# Patient Record
Sex: Female | Born: 1980 | Race: White | Hispanic: No | Smoking: Current every day smoker
Health system: Southern US, Community
[De-identification: ages and names within clinical notes are randomized; demographics above are authoritative.]

## PROBLEM LIST (undated history)

## (undated) DIAGNOSIS — F419 Anxiety disorder, unspecified: Secondary | ICD-10-CM

## (undated) DIAGNOSIS — F32A Depression, unspecified: Secondary | ICD-10-CM

## (undated) DIAGNOSIS — E039 Hypothyroidism, unspecified: Secondary | ICD-10-CM

## (undated) DIAGNOSIS — F329 Major depressive disorder, single episode, unspecified: Secondary | ICD-10-CM

## (undated) DIAGNOSIS — IMO0002 Reserved for concepts with insufficient information to code with codable children: Secondary | ICD-10-CM

## (undated) HISTORY — DX: Reserved for concepts with insufficient information to code with codable children: IMO0002

## (undated) HISTORY — DX: Hypothyroidism, unspecified: E03.9

## (undated) HISTORY — PX: EYE SURGERY: SHX253

## (undated) HISTORY — PX: TONSILLECTOMY: SUR1361

---

## 2012-04-11 ENCOUNTER — Telehealth (HOSPITAL_COMMUNITY): Payer: Self-pay | Admitting: *Deleted

## 2012-04-11 ENCOUNTER — Inpatient Hospital Stay (HOSPITAL_COMMUNITY)
Admission: AD | Admit: 2012-04-11 | Discharge: 2012-04-14 | DRG: 897 | Disposition: A | Discharge status: Home / Self Care | Payer: Federal, State, Local not specified - Other | Source: Ambulatory Visit | Attending: Psychiatry | Admitting: Psychiatry

## 2012-04-11 ENCOUNTER — Encounter (HOSPITAL_COMMUNITY): Payer: Self-pay | Admitting: *Deleted

## 2012-04-11 DIAGNOSIS — G8929 Other chronic pain: Secondary | ICD-10-CM | POA: Diagnosis present

## 2012-04-11 DIAGNOSIS — E039 Hypothyroidism, unspecified: Secondary | ICD-10-CM | POA: Diagnosis present

## 2012-04-11 DIAGNOSIS — F1110 Opioid abuse, uncomplicated: Secondary | ICD-10-CM | POA: Diagnosis present

## 2012-04-11 DIAGNOSIS — F1210 Cannabis abuse, uncomplicated: Secondary | ICD-10-CM | POA: Diagnosis present

## 2012-04-11 DIAGNOSIS — IMO0002 Reserved for concepts with insufficient information to code with codable children: Secondary | ICD-10-CM | POA: Diagnosis present

## 2012-04-11 DIAGNOSIS — F17200 Nicotine dependence, unspecified, uncomplicated: Secondary | ICD-10-CM | POA: Diagnosis present

## 2012-04-11 DIAGNOSIS — F1020 Alcohol dependence, uncomplicated: Secondary | ICD-10-CM | POA: Diagnosis present

## 2012-04-11 DIAGNOSIS — F1120 Opioid dependence, uncomplicated: Principal | ICD-10-CM | POA: Diagnosis present

## 2012-04-11 DIAGNOSIS — F4310 Post-traumatic stress disorder, unspecified: Secondary | ICD-10-CM | POA: Diagnosis present

## 2012-04-11 HISTORY — DX: Reserved for concepts with insufficient information to code with codable children: IMO0002

## 2012-04-11 MED ORDER — DICYCLOMINE HCL 20 MG PO TABS
20.0000 mg | ORAL_TABLET | Freq: Four times a day (QID) | ORAL | Status: DC | PRN
  Administered 2012-04-11: 20 mg via ORAL
  Filled 2012-04-11: qty 1

## 2012-04-11 MED ORDER — NAPROXEN 500 MG PO TABS
500.0000 mg | ORAL_TABLET | Freq: Two times a day (BID) | ORAL | Status: DC | PRN
  Administered 2012-04-11 – 2012-04-12 (×3): 500 mg via ORAL
  Filled 2012-04-11 (×3): qty 1
  Filled 2012-04-11: qty 10

## 2012-04-11 MED ORDER — MAGNESIUM HYDROXIDE 400 MG/5ML PO SUSP: 30.0000 mL | Freq: Every day | ORAL | Status: DC | PRN

## 2012-04-11 MED ORDER — CLONIDINE HCL 0.1 MG PO TABS: 0.1000 mg | ORAL_TABLET | Freq: Every day | ORAL | Status: DC

## 2012-04-11 MED ORDER — ONDANSETRON 4 MG PO TBDP
4.0000 mg | ORAL_TABLET | Freq: Four times a day (QID) | ORAL | Status: DC | PRN
  Administered 2012-04-11: 4 mg via ORAL

## 2012-04-11 MED ORDER — ACETAMINOPHEN 325 MG PO TABS: 650.0000 mg | ORAL_TABLET | Freq: Four times a day (QID) | ORAL | Status: DC | PRN

## 2012-04-11 MED ORDER — LOPERAMIDE HCL 2 MG PO CAPS: 2.0000 mg | ORAL_CAPSULE | ORAL | Status: DC | PRN

## 2012-04-11 MED ORDER — HYDROXYZINE HCL 25 MG PO TABS
25.0000 mg | ORAL_TABLET | Freq: Four times a day (QID) | ORAL | Status: DC | PRN
  Administered 2012-04-12 – 2012-04-13 (×2): 25 mg via ORAL

## 2012-04-11 MED ORDER — CLONIDINE HCL 0.1 MG PO TABS
0.1000 mg | ORAL_TABLET | Freq: Four times a day (QID) | ORAL | Status: AC
  Administered 2012-04-11 – 2012-04-13 (×8): 0.1 mg via ORAL
  Filled 2012-04-11 (×11): qty 1

## 2012-04-11 MED ORDER — TRAZODONE HCL 100 MG PO TABS
100.0000 mg | ORAL_TABLET | Freq: Every evening | ORAL | Status: DC | PRN
  Administered 2012-04-11 – 2012-04-13 (×3): 100 mg via ORAL
  Filled 2012-04-11 (×3): qty 1

## 2012-04-11 MED ORDER — CLONIDINE HCL 0.1 MG PO TABS
0.1000 mg | ORAL_TABLET | ORAL | Status: DC
  Filled 2012-04-11 (×4): qty 1

## 2012-04-11 MED ORDER — ALUM & MAG HYDROXIDE-SIMETH 200-200-20 MG/5ML PO SUSP: 30.0000 mL | ORAL | Status: DC | PRN

## 2012-04-11 MED ORDER — METHOCARBAMOL 500 MG PO TABS
500.0000 mg | ORAL_TABLET | Freq: Three times a day (TID) | ORAL | Status: DC | PRN
  Administered 2012-04-11 – 2012-04-13 (×3): 500 mg via ORAL
  Filled 2012-04-11 (×3): qty 1
  Filled 2012-04-11: qty 15

## 2012-04-11 MED ORDER — NICOTINE POLACRILEX 2 MG MT GUM
2.0000 mg | CHEWING_GUM | OROMUCOSAL | Status: DC | PRN
  Administered 2012-04-12 – 2012-04-13 (×6): 2 mg via ORAL
  Filled 2012-04-11: qty 60
  Filled 2012-04-11: qty 1

## 2012-04-11 NOTE — Tx Team (Addendum)
Initial Interdisciplinary Treatment Plan  PATIENT STRENGTHS: (choose at least two) Ability for insight Average or above average intelligence Capable of independent living Motivation for treatment/growth Supportive family/friends  PATIENT STRESSORS: Substance abuse   PROBLEM LIST: Problem List/Patient Goals Date to be addressed Date deferred Reason deferred Estimated date of resolution  Substance abuse (opiates) 04/11/12     Depression 04/11/12     Suicidal Ideation 04/11/12                                          DISCHARGE CRITERIA:  Improved stabilization in mood, thinking, and/or behavior Motivation to continue treatment in a less acute level of care Withdrawal symptoms are absent or subacute and managed without 24-hour nursing intervention  PRELIMINARY DISCHARGE PLAN: Attend 12-step recovery group Return to previous living arrangement  PATIENT/FAMIILY INVOLVEMENT: This treatment plan has been presented to and reviewed with the patient, Holly Castro.  The patient and family have been given the opportunity to ask questions and make suggestions.  Juliann Pares 04/11/2012, 10:08 PM

## 2012-04-11 NOTE — Progress Notes (Signed)
Pt is 31 year old Caucasion female admitted to the services of Dr. Lewis Shock for the detox from opiates and depression.  Pt last took pain pills ("whatever I can get") on 04/09/12, UDS was negative.   Pt is married with children and wishes to get clean so that she can "stop living this way".  At present Pt has minimal withdrawal symptoms from opiates but is extremely anxious regarding cigarette withdrawal.  She smokes more than 2 packs daily.  Pt states that once she has completed her opiate detox, she would like to program on the [facility] hall to address her depression.  Pt lists her grandmother, Holly Castro, as her emergency contact [phone]

## 2012-04-12 DIAGNOSIS — F329 Major depressive disorder, single episode, unspecified: Secondary | ICD-10-CM

## 2012-04-12 DIAGNOSIS — F1110 Opioid abuse, uncomplicated: Secondary | ICD-10-CM | POA: Diagnosis present

## 2012-04-12 DIAGNOSIS — F4310 Post-traumatic stress disorder, unspecified: Secondary | ICD-10-CM | POA: Diagnosis present

## 2012-04-12 DIAGNOSIS — M79606 Pain in leg, unspecified: Secondary | ICD-10-CM | POA: Diagnosis present

## 2012-04-12 MED ORDER — FLUOXETINE HCL 20 MG PO CAPS
20.0000 mg | ORAL_CAPSULE | Freq: Every day | ORAL | Status: DC
  Administered 2012-04-12 – 2012-04-14 (×3): 20 mg via ORAL
  Filled 2012-04-12: qty 1
  Filled 2012-04-12: qty 14
  Filled 2012-04-12 (×4): qty 1

## 2012-04-12 MED ORDER — LIDOCAINE 5 % EX PTCH
1.0000 | MEDICATED_PATCH | CUTANEOUS | Status: DC
  Administered 2012-04-12 – 2012-04-14 (×3): 1 via TRANSDERMAL
  Filled 2012-04-12 (×6): qty 1
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
## 2012-04-12 NOTE — Progress Notes (Signed)
Patient ID: Holly Castro, female   DOB: [birth year], 31 y.o.   MRN: [id number]  Pt. attended and participated in aftercare planning group. Pt. verbally accepted information on suicide prevention, warning signs to look for with suicide and crisis line numbers to use. Pt. listed their current anxiety level and depression level as high. Pt. spoke about past family issues, self-medicating, negative self-talk, and her struggles as a mother. Pt. aso shared past suicide idealities but is not currently having these thoughts.
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
## 2012-04-12 NOTE — Progress Notes (Signed)
Pt in dayroom on approach, only complaint remains not being able to smoke.  Denies SI/HI/hallucinations at this time.  Positive for evening group, interacting appropriately on unit.  Denies S/S of opiate withdrawal, states that she just needs her nicorette gum and something for sleep and "I will be fine".  Support and encouragement offered, will continue to monitor.
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
## 2012-04-12 NOTE — BHH Suicide Risk Assessment (Signed)
Suicide Risk Assessment  Admission Assessment     Demographic factors:  Assessment Details Time of Assessment: Admission Information Obtained From: Patient Current Mental Status:  Current Mental Status:  (Pt denies) Loss Factors:  Loss Factors:  (denies) Historical Factors:  Historical Factors: Family history of mental illness or substance abuse Risk Reduction Factors:  Risk Reduction Factors: Responsible for children under 29 years of age;Sense of responsibility to family;Living with another person, especially a relative;Positive social support  CLINICAL FACTORS:   Severe Anxiety and/or Agitation Panic Attacks Depression:   Anhedonia Comorbid alcohol abuse/dependence Hopelessness Impulsivity Insomnia Recent sense of peace/wellbeing Dysthymia Alcohol/Substance Abuse/Dependencies Personality Disorders:   Cluster B Chronic Pain Previous Psychiatric Diagnoses and Treatments  COGNITIVE FEATURES THAT CONTRIBUTE TO RISK:  Closed-mindedness Loss of executive function Polarized thinking    SUICIDE RISK:   Minimal: No identifiable suicidal ideation.  Patients presenting with no risk factors but with morbid ruminations; may be classified as minimal risk based on the severity of the depressive symptoms  PLAN OF CARE: Patient was admitted and involuntarily for depression and opiate abuse. Patient was signing consent for treatment voluntarily. Patient will be discharged upon stable from a depression and the detox treatment and able to tolerate outpatient psychiatric services.   [place],Holly R. 04/12/2012, 12:09 PM

## 2012-04-12 NOTE — Progress Notes (Addendum)
[facility] Adult Inpatient Family/Significant Other Suicide Prevention Education  Suicide Prevention Education:  Education Completed; Jill Side (grandmother) [phone],  (name of family member/significant other) has been identified by the patient as the family member/significant other with whom the patient will be residing, and identified as the person(s) who will aid the patient in the event of a mental health crisis (suicidal ideations/suicide attempt).  With written consent from the patient, the family member/significant other has been provided the following suicide prevention education, prior to the and/or following the discharge of the patient.  The suicide prevention education provided includes the following:  Suicide risk factors  Suicide prevention and interventions  National Suicide Hotline telephone number  [facility] assessment telephone number  [facility] Emergency Assistance 911  [facility] and/or Residential Mobile Crisis Unit telephone number  Request made of family/significant other to:  Remove weapons (e.g., guns, rifles, knives), all items previously/currently identified as safety concern.    Remove drugs/medications (over-the-counter, prescriptions, illicit drugs), all items previously/currently identified as a safety concern.  The family member/significant other verbalizes understanding of the suicide prevention education information provided.  The family member/significant other agrees to remove the items of safety concern listed above.  Pt. accepted information on suicide prevention, warning signs to look for with suicide and crisis line numbers to use. The pt. agreed to call crisis line numbers if having warning signs or having thoughts of suicide.  Pt stated that she is not supported by her husband and did not want him contacted. Grandmother stated that "Allysha's problem is Mayela" she spoke about feeling the relationship with the pt and  her husband is fine, and the only problem is she can't be the boss or mange money.   Grandmother stated that the family has guns in a gun cabinet but that the pt has stated that she will not shoot her self for the pt. has told her that she "is afraid of pain".  Grandmother stated that the pt's use has gotten worse and she has gotten very hopeless. She would like to see the pt go to a long term treatment center (3-6 months). She is concerned that something short term wont work.  Grandmother stated she does not know how else to help the pt. She is supportive but does not know how to support her.  [facility] 04/12/2012, 4:20 PM

## 2012-04-12 NOTE — Progress Notes (Signed)
Pt is out occasionally in milieu. Behavior is appropriate. C/O generalized body aches and anxiety r/t to opiate withdrawal. Rates depression and hopelessness at 8. "Off and on" SI and contracts for safety.  Tearful at times.  Encouraged fluids. Attended groups.Consent for voluntary admission and treatment explained and signed. Support offered and 15' checks cont for safety.

## 2012-04-13 DIAGNOSIS — F1210 Cannabis abuse, uncomplicated: Secondary | ICD-10-CM

## 2012-04-13 DIAGNOSIS — F1120 Opioid dependence, uncomplicated: Principal | ICD-10-CM

## 2012-04-13 DIAGNOSIS — F1020 Alcohol dependence, uncomplicated: Secondary | ICD-10-CM

## 2012-04-13 DIAGNOSIS — F4310 Post-traumatic stress disorder, unspecified: Secondary | ICD-10-CM

## 2012-04-13 MED ORDER — LORATADINE 10 MG PO TABS
10.0000 mg | ORAL_TABLET | Freq: Every day | ORAL | Status: DC
  Administered 2012-04-13 – 2012-04-14 (×2): 10 mg via ORAL
  Filled 2012-04-13: qty 1
  Filled 2012-04-13: qty 14
  Filled 2012-04-13 (×3): qty 1

## 2012-04-13 NOTE — Progress Notes (Signed)
[facility] MD Progress Note  04/13/2012 3:07 PM  S/O: Patient seen and evaluated. Chart reviewed. Patient stated that her mood was "not good". Her affect was mood congruent and ambivalent about recovery. She denied any current thoughts of self injurious behavior, suicidal ideation or homicidal ideation. There were no auditory or visual hallucinations, paranoia, delusional thought processes, or mania noted.  Thought process was linear and goal directed.  No psychomotor agitation or retardation was noted. Speech was normal rate, tone and volume. Eye contact was good. Judgment and insight are fair.  Patient has been up and engaged on the unit.  No acute safety concerns reported from team.  Sig w/d s/s noted, pt tolerating taper well w/o complications.  Sleep:  Number of Hours: 6.25    Vital Signs:Blood pressure 101/72, pulse 120, temperature 96.5 F (35.8 C), temperature source Oral, resp. rate 20, height 5\' 5"  (1.651 m), weight 63.957 kg (141 lb).  Current Medications:    . cloNIDine  0.1 mg Oral QID   Followed by  . cloNIDine  0.1 mg Oral BH-qamhs   Followed by  . cloNIDine  0.1 mg Oral QAC breakfast  . FLUoxetine  20 mg Oral Daily  . lidocaine  1 patch Transdermal BH-q7a  . loratadine  10 mg Oral Daily    Lab Results: No results found for this or any previous visit (from the past 48 hour(s)).  Physical Findings: CIWA:  CIWA-Ar Total: 2  COWS:  COWS Total Score: 8   A/P: Opioid Dependence & W/D; Cannabis Abuse; Alcohol Abuse v. Dependence; PTSD  Pt seen and evaluated in treatment team.  Reviewed short term and long term goals, medications, current treatment in the hospital and acute/chronic safety.  Pt denied any current thoughts of self harm, suicidal ideation or homicidal ideation.  Contracted for safety on the unit.  Pt agreeable with treatment plan, see orders. Continue current medications as noted above, added Claritin today per pt request.        Holly Castro 04/13/2012,  3:07 PM

## 2012-04-13 NOTE — Discharge Planning (Signed)
New patient attended AM group, good participation.  States her "nosey grandmother" insisted she come in.  Admits to opiate dependence "to calm myself so I can deal with my husband."  Ambivalent about discontinuing use.  "I'm just confused about everything."  Will follow up with her about rehab as well as outpt options.

## 2012-04-13 NOTE — Progress Notes (Signed)
Patient ID: Holly Castro, female   DOB: 05/02/1981, 31 y.o.   MRN: [id number]      Patient was pleasant and cooperative during the assessment. However, when she was leaving the dayroom to go with the writer, pt stated, "some people need to get an ass".  Pt was referring to the MHT on the hall. When asked to explain, "he must be jealous. He asked [name] to move to another chair". "Well, is there some kind of attraction between the two of you?" "Yes, but it's not like we're gonna have sex or kiss on each other". Writer explained that staff wants to help them remain focused on treatment, and has to be aware of any inappropriate contact between patients. Informed her that not only can it distract the two of them but that it can also distract others.       Pt states that she doesn't want to go home as long as her husband is in the house. States she was informed of the possibility of an [employer] in [place]

## 2012-04-13 NOTE — Progress Notes (Signed)
Patient ID: Holly Castro, female   DOB: 31-May-1981, 32 y.o.   MRN: [id number] She has been up and about and to groups interacting with peers and staff. Continues to have Si thoughts off and on, contracts for safety here.

## 2012-04-13 NOTE — H&P (Signed)
Cosigned for Dr. Jonnalagadda. 

## 2012-04-13 NOTE — Treatment Plan (Signed)
Interdisciplinary Treatment Plan Update (Adult)  Date: 04/13/2012  Time Reviewed: 10:56 AM   Progress in Treatment: Attending groups: Yes Participating in groups: Yes Taking medication as prescribed: Yes Tolerating medication: Yes   Family/Significant other contact made:  No Patient understands diagnosis:  Yes  As evidenced by asking for help with opiate addiction Discussing patient identified problems/goals with staff:  Yes  See below Medical problems stabilized or resolved:  Yes Denies suicidal/homicidal ideation: Yes  In tx team Issues/concerns per patient self-inventory:  Yes  Depression 6, Hopelessness 9  C/O chilling cravings, lightheadedness, dizziness, headaches, pain Other:  New problem(s) identified: N/A  Reason for Continuation of Hospitalization: Depression Medication stabilization Withdrawal symptoms  Interventions implemented related to continuation of hospitalization:  Clonodine taper,  Claritin for allergies, encourage group attendance and participation  Additional comments:  Estimated length of stay:2-3 days  Discharge Plan:Unknown  New goal(s): N/A  Review of initial/current patient goals per problem list:   1.  Goal(s): Safely detox from opiates  Met:  No  Target date:5/29  As evidenced by:Stable vitals, COWS score of 0  2.  Goal (s): Stabilize mood  Met:  No  Target date:5/29  As evidenced by:[name] will report that her depression and anxiety are manageable  3.  Goal(s): Identify a comprehensive sobreity plan  Met:  No  Target date:5/29  As evidenced DG:LOVF report  4.  Goal(s):  Met:  No  Target date:  As evidenced by:  Attendees: Patient:  Holly Castro 04/13/2012 10:56 AM  Family:     Physician:  Holly Castro 04/13/2012 10:56 AM   Nursing:  Holly Castro  04/13/2012 10:56 AM   Case Manager:  Holly Ito, LCSW 04/13/2012 10:56 AM   Counselor:  04/13/2012 10:56 AM   Other:     Other:     Other:     Other:      Scribe  for Treatment Team:   Holly Castro, 04/13/2012 10:56 AM

## 2012-04-14 MED ORDER — TRAZODONE HCL 100 MG PO TABS: 100.0000 mg | ORAL_TABLET | Freq: Every evening | ORAL | Status: DC | PRN

## 2012-04-14 MED ORDER — METHOCARBAMOL 500 MG PO TABS: ORAL_TABLET | ORAL | Status: DC

## 2012-04-14 MED ORDER — LORATADINE 10 MG PO TABS: 10.0000 mg | ORAL_TABLET | Freq: Every day | ORAL | Status: DC

## 2012-04-14 MED ORDER — FLUOXETINE HCL 20 MG PO CAPS: 20.0000 mg | ORAL_CAPSULE | Freq: Every day | ORAL | Status: DC

## 2012-04-14 MED ORDER — NICOTINE POLACRILEX 2 MG MT GUM: 2.0000 mg | CHEWING_GUM | OROMUCOSAL | Status: AC | PRN

## 2012-04-14 MED ORDER — NAPROXEN 500 MG PO TABS: 500.0000 mg | ORAL_TABLET | Freq: Two times a day (BID) | ORAL | Status: AC | PRN

## 2012-04-14 NOTE — [facility] Suicide Risk Assessment (Signed)
Suicide Risk Assessment  Discharge Assessment      Demographic factors: Caucasian  Current Mental Status Per Nursing Assessment:   On Admission:   (Pt denies) At Discharge: Pt denied any SI/HI/thoughts of self harm or acute psychiatric issues in treatment team with clinical, nursing and medical team present.  Current Mental Status Per Physician:Patient seen and evaluated. Chart reviewed. Patient stated that her mood was "pretty good". Her affect was mood congruent and brighter. She denied any current thoughts of self injurious behavior, suicidal ideation or homicidal ideation. There were no auditory or visual hallucinations, paranoia, delusional thought processes, or mania noted. Thought process was linear and goal directed. No psychomotor agitation or retardation was noted. Speech was normal rate, tone and volume. Eye contact was good. Judgment and insight are fair. Patient has been up and engaged on the unit. No acute safety concerns reported from team.   Loss Factors:  Stress with family; hx trauma  Historical Factors:  Family history of mental illness or substance abuse  Risk Reduction Factors:  Responsible for children under 59 years of age;Sense of responsibility to family;Living with another person, especially a relative;Positive social support  Discharge Diagnoses: AXIS I:  Opioid Dependence; Cannabis Abuse; Alcohol Dependence; PTSD AXIS II: Deferred AXIS III:   Past Medical History  Diagnosis Date  . Hypothyroidism   . Herniated disc 04/11/2012  AXIS IV:  Moderate AXIS V:  50  Cognitive Features That Contribute To Risk: none.  Suicide Risk: Pt viewed as a chronic moderate increased risk of harm to self in light of her past hx and risk factors.  No acute safety concerns since on the unit.  Pt contracting for safety and stable for transition to [place].  Filed Vitals:   04/14/12 0730  BP: 98/75  Pulse: 91  Temp: 97 F (36.1 C)  Resp: 18    Plan Of Care/Follow-up  recommendations: Pt seen and evaluated in treatment team. Chart reviewed.  Pt stable for and bed available today at [facility].  Pt contracting for safety and does not currently meet [place] involuntary commitment criteria for continued inpt hospitalization against her will.  Mental health treatment, medication management and continued sobriety will mitigate against the potential increased risk of harm to self and/or others.  Discussed the importance of recovery further with pt, as well as, tools to move forward in a healthy & safe manner.  Pt agreeable with the plan.  Discussed with the team.  Please see orders, follow up appointments per AVS and full discharge summary to be completed by physician extender.  Recommend follow up with AA/NA.  Diet: Regular.  Activity: As tolerated.     Lupe Carney 04/14/2012, 11:03 AM

## 2012-04-14 NOTE — Progress Notes (Signed)
[facility] Case Management Discharge Plan:  Will you be returning to the same living situation after discharge: No. At discharge, do you have transportation home?:Yes,  [facility] to pick up today Do you have the ability to pay for your medications:Yes,  sent with supply  Interagency Information:     Release of information consent forms completed and in the chart;  Patient's signature needed at discharge.  Patient to Follow up at:  Follow-up Information    Follow up with [facility] on 04/14/2012. ([facility] will pick you up today)    Contact information:   [address] [place]   [336] [phone]         Patient denies SI/HI:   Yes,  yes    Safety Planning and Suicide Prevention discussed:  Yes,  yes  Barrier to discharge identified:No.  Summary and Recommendations:   Holly Castro 04/14/2012, 10:12 AM

## 2012-04-16 NOTE — Progress Notes (Signed)
Patient Discharge Instructions:  After Visit Summary (AVS):   Faxed to:  04/15/2012 Psychiatric Admission Assessment Note:   Faxed to:  04/15/2012 Suicide Risk Assessment - Discharge Assessment:   Faxed to:  04/15/2012 Faxed/Sent to the Next Level Care provider:  04/15/2012  Faxed to [facility] @ [phone]  Wandra Scot, 04/16/2012, 5:38 PM
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
## 2013-06-08 ENCOUNTER — Emergency Department (HOSPITAL_BASED_OUTPATIENT_CLINIC_OR_DEPARTMENT_OTHER)
Admission: EM | Admit: 2013-06-08 | Discharge: 2013-06-08 | Disposition: A | Discharge status: Home / Self Care | Payer: Self-pay | Attending: Emergency Medicine | Admitting: Emergency Medicine

## 2013-06-08 ENCOUNTER — Encounter (HOSPITAL_BASED_OUTPATIENT_CLINIC_OR_DEPARTMENT_OTHER): Payer: Self-pay | Admitting: *Deleted

## 2013-06-08 DIAGNOSIS — F17200 Nicotine dependence, unspecified, uncomplicated: Secondary | ICD-10-CM | POA: Insufficient documentation

## 2013-06-08 DIAGNOSIS — K089 Disorder of teeth and supporting structures, unspecified: Secondary | ICD-10-CM | POA: Insufficient documentation

## 2013-06-08 DIAGNOSIS — Z8639 Personal history of other endocrine, nutritional and metabolic disease: Secondary | ICD-10-CM | POA: Insufficient documentation

## 2013-06-08 DIAGNOSIS — Z79899 Other long term (current) drug therapy: Secondary | ICD-10-CM | POA: Insufficient documentation

## 2013-06-08 DIAGNOSIS — K0889 Other specified disorders of teeth and supporting structures: Secondary | ICD-10-CM

## 2013-06-08 DIAGNOSIS — Z862 Personal history of diseases of the blood and blood-forming organs and certain disorders involving the immune mechanism: Secondary | ICD-10-CM | POA: Insufficient documentation

## 2013-06-08 DIAGNOSIS — Z8739 Personal history of other diseases of the musculoskeletal system and connective tissue: Secondary | ICD-10-CM | POA: Insufficient documentation

## 2013-06-08 HISTORY — DX: Depression, unspecified: F32.A

## 2013-06-08 HISTORY — DX: Major depressive disorder, single episode, unspecified: F32.9

## 2013-06-08 MED ORDER — TRAMADOL HCL 50 MG PO TABS: 50.0000 mg | ORAL_TABLET | Freq: Once | ORAL | Status: DC

## 2013-06-08 MED ORDER — TRAMADOL HCL 50 MG PO TABS
50.0000 mg | ORAL_TABLET | Freq: Once | ORAL | Status: AC
  Administered 2013-06-08: 50 mg via ORAL
  Filled 2013-06-08: qty 1

## 2013-06-08 MED ORDER — PENICILLIN V POTASSIUM 500 MG PO TABS: 500.0000 mg | ORAL_TABLET | Freq: Four times a day (QID) | ORAL | Status: DC

## 2013-06-08 NOTE — ED Notes (Signed)
Pt amb to room 6 with quick steady gait in nad. Pt reports tooth pain x 3 days, denies any fevers.

## 2013-06-08 NOTE — ED Provider Notes (Signed)
History    CSN: [id number] Arrival date & time 06/08/13  1157  First MD Initiated Contact with Patient 06/08/13 1208     No chief complaint on file.  (Consider location/radiation/quality/duration/timing/severity/associated sxs/prior Treatment) HPI  Patient is a 32 year old female presented to the emergency department for sharp right upper tooth pain with radiation to right ear that began 2 days ago. Patient states she's had a chipped tooth for a while now but it just began giving her trouble 2 days ago. She states cold liquids make the pain worse. No alleviating factors. Rates her pain 8/10. Denies fevers or chills.   Past Medical History  Diagnosis Date  . Hypothyroidism   . Herniated disc 04/11/2012   No past surgical history on file. No family history on file. History  Substance Use Topics  . Smoking status: Current Every Day Smoker -- 2.00 packs/day    Types: Cigarettes  . Smokeless tobacco: Not on file  . Alcohol Use: Yes   OB History   Grav Para Term Preterm Abortions TAB SAB Ect Mult Living                 Review of Systems  Constitutional: Negative for fever and chills.  HENT: Positive for dental problem. Negative for sore throat, facial swelling, drooling, mouth sores, trouble swallowing, neck pain, neck stiffness and voice change.     Allergies  Review of patient's allergies indicates no known allergies.  Home Medications   Current Outpatient Rx  Name  Route  Sig  Dispense  Refill  . FLUoxetine (PROZAC) 20 MG capsule   Oral   Take 1 capsule (20 mg total) by mouth daily. For depression and anxiety   30 capsule   0   . EXPIRED: loratadine (CLARITIN) 10 MG tablet   Oral   Take 1 tablet (10 mg total) by mouth daily. For allergy symptoms         . methocarbamol (ROBAXIN) 500 MG tablet      Take one tablet by mouth every 8 hours, as needed for muscle spasms for 5 more days.         . penicillin v potassium (VEETID) 500 MG tablet   Oral   Take 1  tablet (500 mg total) by mouth 4 (four) times daily.   40 tablet   0   . traMADol (ULTRAM) 50 MG tablet   Oral   Take 1 tablet (50 mg total) by mouth once.   15 tablet   0   . EXPIRED: traZODone (DESYREL) 100 MG tablet   Oral   Take 1 tablet (100 mg total) by mouth at bedtime as needed and may repeat dose one time if needed for sleep.   30 tablet   0    BP 129/81  Pulse 90  Temp(Src) 98 F (36.7 C) (Oral)  Resp 16  Ht 5\' 5"  (1.651 m)  Wt 170 lb (77.111 kg)  BMI 28.29 kg/m2  SpO2 100% Physical Exam  Constitutional: She is oriented to person, place, and time. She appears well-developed and well-nourished. No distress.  HENT:  Head: Normocephalic and atraumatic. No trismus in the jaw.  Right Ear: External ear normal.  Left Ear: External ear normal.  Nose: Nose normal.  Mouth/Throat: Uvula is midline, oropharynx is clear and moist and mucous membranes are normal. Abnormal dentition. Dental caries present. No dental abscesses or edematous. No oropharyngeal exudate.    Eyes: Conjunctivae are normal.  Neck: Neck supple.  Neurological: She is  alert and oriented to person, place, and time.  Skin: Skin is warm and dry. She is not diaphoretic.  Psychiatric: She has a normal mood and affect.    ED Course  Procedures (including critical care time) Labs Reviewed - No data to display No results found. 1. Pain, dental     MDM  Patient with toothache.  No gross abscess.  Exam unconcerning for Ludwig's angina or spread of infection.  Will treat with penicillin and pain medicine.  Urged patient to follow-up with dentist.     Jeannetta Ellis, PA-C 06/08/13 1537

## 2013-06-09 NOTE — ED Provider Notes (Signed)
Medical screening examination/treatment/procedure(s) were performed by non-physician practitioner and as supervising physician I was immediately available for consultation/collaboration.  Damarys Speir, MD 06/09/13 1809 

## 2013-07-19 ENCOUNTER — Emergency Department (HOSPITAL_COMMUNITY)
Admission: EM | Admit: 2013-07-19 | Discharge: 2013-07-20 | Disposition: A | Discharge status: Home / Self Care | Payer: Federal, State, Local not specified - Other | Attending: Emergency Medicine | Admitting: Emergency Medicine

## 2013-07-19 ENCOUNTER — Encounter (HOSPITAL_COMMUNITY): Payer: Self-pay

## 2013-07-19 DIAGNOSIS — F1010 Alcohol abuse, uncomplicated: Secondary | ICD-10-CM | POA: Insufficient documentation

## 2013-07-19 DIAGNOSIS — M545 Low back pain, unspecified: Secondary | ICD-10-CM | POA: Insufficient documentation

## 2013-07-19 DIAGNOSIS — Z862 Personal history of diseases of the blood and blood-forming organs and certain disorders involving the immune mechanism: Secondary | ICD-10-CM | POA: Insufficient documentation

## 2013-07-19 DIAGNOSIS — Z79899 Other long term (current) drug therapy: Secondary | ICD-10-CM | POA: Insufficient documentation

## 2013-07-19 DIAGNOSIS — F329 Major depressive disorder, single episode, unspecified: Secondary | ICD-10-CM | POA: Insufficient documentation

## 2013-07-19 DIAGNOSIS — F3289 Other specified depressive episodes: Secondary | ICD-10-CM | POA: Insufficient documentation

## 2013-07-19 DIAGNOSIS — G8929 Other chronic pain: Secondary | ICD-10-CM | POA: Insufficient documentation

## 2013-07-19 DIAGNOSIS — Z792 Long term (current) use of antibiotics: Secondary | ICD-10-CM | POA: Insufficient documentation

## 2013-07-19 DIAGNOSIS — Z3202 Encounter for pregnancy test, result negative: Secondary | ICD-10-CM | POA: Insufficient documentation

## 2013-07-19 DIAGNOSIS — F1110 Opioid abuse, uncomplicated: Secondary | ICD-10-CM | POA: Insufficient documentation

## 2013-07-19 DIAGNOSIS — F10229 Alcohol dependence with intoxication, unspecified: Secondary | ICD-10-CM

## 2013-07-19 DIAGNOSIS — R45851 Suicidal ideations: Secondary | ICD-10-CM | POA: Insufficient documentation

## 2013-07-19 DIAGNOSIS — F1994 Other psychoactive substance use, unspecified with psychoactive substance-induced mood disorder: Secondary | ICD-10-CM

## 2013-07-19 DIAGNOSIS — Z8639 Personal history of other endocrine, nutritional and metabolic disease: Secondary | ICD-10-CM | POA: Insufficient documentation

## 2013-07-19 DIAGNOSIS — F17200 Nicotine dependence, unspecified, uncomplicated: Secondary | ICD-10-CM | POA: Insufficient documentation

## 2013-07-19 DIAGNOSIS — F4310 Post-traumatic stress disorder, unspecified: Secondary | ICD-10-CM

## 2013-07-19 DIAGNOSIS — F1910 Other psychoactive substance abuse, uncomplicated: Secondary | ICD-10-CM | POA: Insufficient documentation

## 2013-07-19 LAB — COMPREHENSIVE METABOLIC PANEL
ALT: 10 U/L (ref 0–35)
AST: 16 U/L (ref 0–37)
Alkaline Phosphatase: 57 U/L (ref 39–117)
Calcium: 8.6 mg/dL (ref 8.4–10.5)
Potassium: 2.8 mEq/L — ABNORMAL LOW (ref 3.5–5.1)
Sodium: 140 mEq/L (ref 135–145)
Total Protein: 6.4 g/dL (ref 6.0–8.3)

## 2013-07-19 LAB — CBC
Hemoglobin: 14.5 g/dL (ref 12.0–15.0)
MCH: 31.7 pg (ref 26.0–34.0)
MCHC: 33.6 g/dL (ref 30.0–36.0)
Platelets: 233 10*3/uL (ref 150–400)
RBC: 4.57 MIL/uL (ref 3.87–5.11)

## 2013-07-19 LAB — RAPID URINE DRUG SCREEN, HOSP PERFORMED
Amphetamines: NOT DETECTED
Barbiturates: NOT DETECTED
Benzodiazepines: NOT DETECTED
Cocaine: NOT DETECTED
Opiates: NOT DETECTED
Tetrahydrocannabinol: NOT DETECTED

## 2013-07-19 LAB — POTASSIUM: Potassium: 3.8 mEq/L (ref 3.5–5.1)

## 2013-07-19 LAB — ACETAMINOPHEN LEVEL: Acetaminophen (Tylenol), Serum: <15 ug/mL (ref 10–30)

## 2013-07-19 MED ORDER — FLUOXETINE HCL 20 MG PO CAPS: 20.0000 mg | ORAL_CAPSULE | Freq: Every day | ORAL | Status: DC

## 2013-07-19 MED ORDER — HYDROXYZINE HCL 25 MG PO TABS
50.0000 mg | ORAL_TABLET | Freq: Three times a day (TID) | ORAL | Status: DC | PRN
  Administered 2013-07-19: 50 mg via ORAL
  Filled 2013-07-19: qty 2

## 2013-07-19 MED ORDER — IBUPROFEN 800 MG PO TABS
800.0000 mg | ORAL_TABLET | Freq: Once | ORAL | Status: AC
  Administered 2013-07-19: 800 mg via ORAL
  Filled 2013-07-19: qty 1

## 2013-07-19 MED ORDER — POTASSIUM CHLORIDE CRYS ER 20 MEQ PO TBCR
40.0000 meq | EXTENDED_RELEASE_TABLET | Freq: Once | ORAL | Status: AC
  Administered 2013-07-19: 40 meq via ORAL

## 2013-07-19 MED ORDER — SERTRALINE HCL 50 MG PO TABS
50.0000 mg | ORAL_TABLET | Freq: Every day | ORAL | Status: DC
  Administered 2013-07-19 – 2013-07-20 (×2): 50 mg via ORAL
  Filled 2013-07-19 (×2): qty 1

## 2013-07-19 MED ORDER — NICOTINE 21 MG/24HR TD PT24
21.0000 mg | MEDICATED_PATCH | Freq: Once | TRANSDERMAL | Status: AC
  Administered 2013-07-19: 21 mg via TRANSDERMAL
  Filled 2013-07-19: qty 1

## 2013-07-19 MED ORDER — METHOCARBAMOL 500 MG PO TABS
500.0000 mg | ORAL_TABLET | Freq: Three times a day (TID) | ORAL | Status: DC
  Administered 2013-07-19 – 2013-07-20 (×4): 500 mg via ORAL
  Filled 2013-07-19 (×4): qty 1

## 2013-07-19 MED ORDER — POTASSIUM CHLORIDE CRYS ER 20 MEQ PO TBCR
40.0000 meq | EXTENDED_RELEASE_TABLET | Freq: Two times a day (BID) | ORAL | Status: AC
  Administered 2013-07-19 – 2013-07-20 (×4): 40 meq via ORAL
  Filled 2013-07-19 (×3): qty 2
  Filled 2013-07-19: qty 1

## 2013-07-19 NOTE — ED Notes (Signed)
Patient wanded by security. 

## 2013-07-19 NOTE — ED Provider Notes (Signed)
CSN: [id number]     Arrival date & time 07/19/13  0014 History   First MD Initiated Contact with Patient 07/19/13 0118     Chief Complaint  Patient presents with  . Suicidal  . Medical Clearance   (Consider location/radiation/quality/duration/timing/severity/associated sxs/prior Treatment) HPI Hx provided by patient. Id here requesting detox from alcohol, drugs and heroin. She last used heroin yesterday and today is having chills, cold sweats and diarrhea. She feels depressed with history of depression. She is also requesting pain medications for chronic low back pain with a known herniated disc. She denies any active suicidal ideation or plan. No homicidal ideation. No hallucinations. With her back pain does not have any urinary or bowel incontinence, no radiating pain, no fevers.  No chest pain or shortness of breath. Past Medical History  Diagnosis Date  . Hypothyroidism   . Herniated disc 04/11/2012  . Depression    Past Surgical History  Procedure Laterality Date  . Eye surgery    . Tonsillectomy     No family history on file. History  Substance Use Topics  . Smoking status: Current Every Day Smoker -- 1.00 packs/day    Types: Cigarettes  . Smokeless tobacco: Never Used  . Alcohol Use: Yes     Comment: daily    OB History   Grav Para Term Preterm Abortions TAB SAB Ect Mult Living                 Review of Systems  Constitutional: Negative for fever.  HENT: Negative for neck pain and neck stiffness.   Eyes: Negative for visual disturbance.  Respiratory: Negative for shortness of breath.   Cardiovascular: Negative for chest pain.  Gastrointestinal: Negative for vomiting and abdominal pain.  Genitourinary: Negative for dysuria.  Musculoskeletal: Positive for back pain.  Skin: Negative for rash.  Neurological: Negative for headaches.  All other systems reviewed and are negative.    Allergies  Review of patient's allergies indicates no known allergies.  Home  Medications   Current Outpatient Rx  Name  Route  Sig  Dispense  Refill  . FLUoxetine (PROZAC) 20 MG capsule   Oral   Take 1 capsule (20 mg total) by mouth daily. For depression and anxiety   30 capsule   0   . EXPIRED: loratadine (CLARITIN) 10 MG tablet   Oral   Take 1 tablet (10 mg total) by mouth daily. For allergy symptoms         . methocarbamol (ROBAXIN) 500 MG tablet      Take one tablet by mouth every 8 hours, as needed for muscle spasms for 5 more days.         . penicillin v potassium (VEETID) 500 MG tablet   Oral   Take 1 tablet (500 mg total) by mouth 4 (four) times daily.   40 tablet   0   . sertraline (ZOLOFT) 50 MG tablet   Oral   Take 50 mg by mouth daily.         . traMADol (ULTRAM) 50 MG tablet   Oral   Take 1 tablet (50 mg total) by mouth once.   15 tablet   0   . EXPIRED: traZODone (DESYREL) 100 MG tablet   Oral   Take 1 tablet (100 mg total) by mouth at bedtime as needed and may repeat dose one time if needed for sleep.   30 tablet   0    BP 140/89  Pulse 78  Temp(Src) 98.8 F (37.1 C) (Oral)  Resp 20  SpO2 100%  LMP 06/18/2013 Physical Exam  Constitutional: She is oriented to person, place, and time. She appears well-developed and well-nourished.  HENT:  Head: Normocephalic and atraumatic.  Eyes: EOM are normal. Pupils are equal, round, and reactive to light.  Neck: Neck supple.  Cardiovascular: Regular rhythm and intact distal pulses.   Pulmonary/Chest: Effort normal. No respiratory distress.  Abdominal: Soft. Bowel sounds are normal. She exhibits no distension. There is no tenderness.  Musculoskeletal: Normal range of motion. She exhibits no edema.  No lower extremity deficits with gait intact  Neurological: She is alert and oriented to person, place, and time. No cranial nerve deficit.  Skin: Skin is warm and dry.  Psychiatric: Judgment and thought content normal.    ED Course  Procedures (including critical care  time)  Results for orders placed during the hospital encounter of 07/19/13  ACETAMINOPHEN LEVEL      Result Value Range   Acetaminophen (Tylenol), Serum <15.0  10 - 30 ug/mL  CBC      Result Value Range   WBC 10.0  4.0 - 10.5 K/uL   RBC 4.57  3.87 - 5.11 MIL/uL   Hemoglobin 14.5  12.0 - 15.0 g/dL   HCT 16.1  09.6 - 04.5 %   MCV 94.5  78.0 - 100.0 fL   MCH 31.7  26.0 - 34.0 pg   MCHC 33.6  30.0 - 36.0 g/dL   RDW 40.9  81.1 - 91.4 %   Platelets 233  150 - 400 K/uL  COMPREHENSIVE METABOLIC PANEL      Result Value Range   Sodium 140  135 - 145 mEq/L   Potassium 2.8 (*) 3.5 - 5.1 mEq/L   Chloride 103  96 - 112 mEq/L   CO2 27  19 - 32 mEq/L   Glucose, Bld 79  70 - 99 mg/dL   BUN 5 (*) 6 - 23 mg/dL   Creatinine, Ser 7.82  0.50 - 1.10 mg/dL   Calcium 8.6  8.4 - 95.6 mg/dL   Total Protein 6.4  6.0 - 8.3 g/dL   Albumin 3.2 (*) 3.5 - 5.2 g/dL   AST 16  0 - 37 U/L   ALT 10  0 - 35 U/L   Alkaline Phosphatase 57  39 - 117 U/L   Total Bilirubin 0.2 (*) 0.3 - 1.2 mg/dL   GFR calc non Af Amer >90  >90 mL/min   GFR calc Af Amer >90  >90 mL/min  ETHANOL      Result Value Range   Alcohol, Ethyl (B) 111 (*) 0 - 11 mg/dL  SALICYLATE LEVEL      Result Value Range   Salicylate Lvl <2.0 (*) 2.8 - 20.0 mg/dL  URINE RAPID DRUG SCREEN (HOSP PERFORMED)      Result Value Range   Opiates NONE DETECTED  NONE DETECTED   Cocaine NONE DETECTED  NONE DETECTED   Benzodiazepines NONE DETECTED  NONE DETECTED   Amphetamines NONE DETECTED  NONE DETECTED   Tetrahydrocannabinol NONE DETECTED  NONE DETECTED   Barbiturates NONE DETECTED  NONE DETECTED  POCT PREGNANCY, URINE      Result Value Range   Preg Test, Ur NEGATIVE  NEGATIVE    Motrin for back pain  PT now states she is suicidal. PSY consult requested, VS WNL.   Potassium provided for hypokalemia. Will recheck potassium level 12pm   MDM  Polysubstance abuse requesting detox History of  depression now stating SI Labs reviewed, hypokalemia,  otherwise medically cleared   Sunnie Nielsen, MD 07/19/13 [phone]

## 2013-07-19 NOTE — ED Notes (Signed)
Pt has shoes, shirt, pants, undergarments, keys, silver colored ear rings, 1 dollar and some change. A [employer] phone with a broken screen
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
## 2013-07-19 NOTE — BH Assessment (Signed)
Contacted [facility] for placement. Their staff say Pt's potassium need to be at least 3.5 before Pt can be consider for their program.  Pamalee Leyden, [facility], [facility] Triage Specialist

## 2013-07-19 NOTE — ED Notes (Signed)
Patient complains of increase anxiety and is also tearful at this time. Encouragement and support provided to patient and patient will also be medicated per MAR. Safety maintain.

## 2013-07-19 NOTE — Consult Note (Signed)
  Holly Sneed[birth year], [id number]  SUBJECTIVE: Patient was seen on round this evening crying and wanting to leave.  She reports not having a place to stay or a job.  She also states she is tired of her chronic back pain from herniated disc.  Patient states she does not have money to go seek care at a pain clinic and instead uses Heroin because it is cheaper.  Patient states she feel hopeless ans helpless.  She is anxious about where she will go to when she is discharged and is looking forward to moving in with her sister.  This Clinical research associate informed patient that we will continue to offer her medication for her opioid detox and we will keep her overnight to be evaluated by next provider.  Patient agrees to this plan of care.  We will continue to offer her her antidepressant.   Mental Status Examination:  Patient appears stated age, is awake, alert and oriented x3 and she  look disheveled.  She reports her mood to be depressed and her affect is congruent.  She also presents a labile affect with teary eyes.  Her concentration and attention is good, she denies SI/HI/AVH and she does not exhibit paranoia.  Her insight and judgement is fair.    Current MedicationCurrent facility-administered medications:hydrOXYzine (ATARAX/VISTARIL) tablet 50 mg, 50 mg, Oral, TID PRN, Earney Navy, NP;  methocarbamol (ROBAXIN) tablet 500 mg, 500 mg, Oral, TID, Sunnie Nielsen, MD, 500 mg at 07/19/13 1626;  nicotine (NICODERM CQ - dosed in mg/24 hours) patch 21 mg, 21 mg, Transdermal, Once, Cleotis Nipper, MD, 21 mg at 07/19/13 1054 potassium chloride SA (K-DUR,KLOR-CON) CR tablet 40 mEq, 40 mEq, Oral, BID, Sunnie Nielsen, MD, 40 mEq at 07/19/13 1018;  sertraline (ZOLOFT) tablet 50 mg, 50 mg, Oral, Daily, Sunnie Nielsen, MD, 50 mg at 07/19/13 1018 Current outpatient prescriptions:ibuprofen (ADVIL,MOTRIN) 200 MG tablet, Take 1,200 mg by mouth every 6 (six) hours as needed (For back pain.)., Disp: , Rfl: ;  sertraline (ZOLOFT) 100 MG tablet,  Take 100 mg by mouth every morning., Disp: , Rfl:    Assessment Axis I Alcohol dependence with acute intoxication;opiate abuse with ?dependence:PTSD;Nicotine Dependence;SIMD Axis II defer Axis III  See H/P Axis IV Housing problem, psychosocial and environmental issues, financial difficulty, joblessness.  Axis V [id number]   Plan: Keep overnight for next provider to evaluate Continue monitoring for safety and stabilization We will refer to [facility] as original planned in am  I agreed with the findings, treatment and disposition plan of this patient. Kathryne Sharper, MD

## 2013-07-19 NOTE — Consult Note (Signed)
[facility] Face-to-Face Psychiatry Consult   Reason for Consult:  ED referral Referring Physician:  ED providers  Hortencia Martire is an 32 y.o. female.  Assessment: AXIS I:  Alcohol dependence with acute intoxication;opiate abuse with ?dependence:PTSD;Nictene Dependence;SIMD AXIS II:  Deferred AXIS III:   Past Medical History  Diagnosis Date  . Hypothyroidism   . Herniated disc 04/11/2012  . Depression    AXIS IV:  housing problems AXIS V:  21-30 behavior considerably influenced by delusions or hallucinations OR serious impairment in judgment, communication OR inability to function in almost all areas  Plan:  Detox and treatment for her chemical dependencies  Subjective:   Shakiah Wester is a 32 y.o. female patient admitted with c/o wothhlessness/hopelessness /homelss and living out of car due to her relapse after 9 months clean and sober from May of 2013.She doesn't know why she relapsed.Marland Kitchen  HPI:  As above HPI Elements:   Context:  see Subjective.  Past Psychiatric History: Past Medical History  Diagnosis Date  . Hypothyroidism   . Herniated disc 04/11/2012  . Depression     reports that she has been smoking Cigarettes.  She has been smoking about 1.00 pack per day. She has never used smokeless tobacco. She reports that  drinks alcohol. She reports that she uses illicit drugs (Oxycodone and Heroin). No family history on file.         Allergies:  No Known Allergies  ACT Assessment Complete:  No:   Past Psychiatric History: Diagnosis:  Poly substance dependence  Hospitalizations:  May 2013 [facility] Detox and traetment  Outpatient Care:  [facility] 2013 followed by 7-8 months in [facility]   Substance Abuse Care:  As above  Self-Mutilation:no  Suicidal Attempts:  no  Homicidal Behaviors:  none   Violent Behaviors:  None known/reported.Pt has hx of PTSD    Place of Residence:  Her car Marital Status:  Husband has children Employed/Unemployed:  Unemployed Education:  HS Family Supports:   Lacking due to her relapse except for her Grandmother Objective: Blood pressure 140/89, pulse 78, temperature 98.8 F (37.1 C), temperature source Oral, resp. rate 20, last menstrual period 06/18/2013, SpO2 100.00%.There is no weight on file to calculate BMI. Results for orders placed during the hospital encounter of 07/19/13 (from the past 72 hour(s))  URINE RAPID DRUG SCREEN (HOSP PERFORMED)     Status: None   Collection Time    07/19/13 12:44 AM      Result Value Range   Opiates NONE DETECTED  NONE DETECTED   Cocaine NONE DETECTED  NONE DETECTED   Benzodiazepines NONE DETECTED  NONE DETECTED   Amphetamines NONE DETECTED  NONE DETECTED   Tetrahydrocannabinol NONE DETECTED  NONE DETECTED   Barbiturates NONE DETECTED  NONE DETECTED   Comment:            DRUG SCREEN FOR MEDICAL PURPOSES     ONLY.  IF CONFIRMATION IS NEEDED     FOR ANY PURPOSE, NOTIFY LAB     WITHIN 5 DAYS.                LOWEST DETECTABLE LIMITS     FOR URINE DRUG SCREEN     Drug Class       Cutoff (ng/mL)     Amphetamine      1000     Barbiturate      200     Benzodiazepine   200     Tricyclics       300  Opiates          300     Cocaine          300     THC              50  POCT PREGNANCY, URINE     Status: None   Collection Time    07/19/13 12:55 AM      Result Value Range   Preg Test, Ur NEGATIVE  NEGATIVE   Comment:            THE SENSITIVITY OF THIS     METHODOLOGY IS >24 mIU/mL  ACETAMINOPHEN LEVEL     Status: None   Collection Time    07/19/13  1:00 AM      Result Value Range   Acetaminophen (Tylenol), Serum <15.0  10 - 30 ug/mL   Comment:            THERAPEUTIC CONCENTRATIONS VARY     SIGNIFICANTLY. A RANGE OF 10-30     ug/mL MAY BE AN EFFECTIVE     CONCENTRATION FOR MANY PATIENTS.     HOWEVER, SOME ARE BEST TREATED     AT CONCENTRATIONS OUTSIDE THIS     RANGE.     ACETAMINOPHEN CONCENTRATIONS     >150 ug/mL AT 4 HOURS AFTER     INGESTION AND >50 ug/mL AT 12     HOURS AFTER INGESTION  ARE     OFTEN ASSOCIATED WITH TOXIC     REACTIONS.  CBC     Status: None   Collection Time    07/19/13  1:00 AM      Result Value Range   WBC 10.0  4.0 - 10.5 K/uL   RBC 4.57  3.87 - 5.11 MIL/uL   Hemoglobin 14.5  12.0 - 15.0 g/dL   HCT 91.4  78.2 - 95.6 %   MCV 94.5  78.0 - 100.0 fL   MCH 31.7  26.0 - 34.0 pg   MCHC 33.6  30.0 - 36.0 g/dL   RDW 21.3  08.6 - 57.8 %   Platelets 233  150 - 400 K/uL  COMPREHENSIVE METABOLIC PANEL     Status: Abnormal   Collection Time    07/19/13  1:00 AM      Result Value Range   Sodium 140  135 - 145 mEq/L   Potassium 2.8 (*) 3.5 - 5.1 mEq/L   Chloride 103  96 - 112 mEq/L   CO2 27  19 - 32 mEq/L   Glucose, Bld 79  70 - 99 mg/dL   BUN 5 (*) 6 - 23 mg/dL   Creatinine, Ser 4.69  0.50 - 1.10 mg/dL   Calcium 8.6  8.4 - 62.9 mg/dL   Total Protein 6.4  6.0 - 8.3 g/dL   Albumin 3.2 (*) 3.5 - 5.2 g/dL   AST 16  0 - 37 U/L   ALT 10  0 - 35 U/L   Alkaline Phosphatase 57  39 - 117 U/L   Total Bilirubin 0.2 (*) 0.3 - 1.2 mg/dL   GFR calc non Af Amer >90  >90 mL/min   GFR calc Af Amer >90  >90 mL/min   Comment: (NOTE)     The eGFR has been calculated using the CKD EPI equation.     This calculation has not been validated in all clinical situations.     eGFR's persistently <90 mL/min signify possible Chronic Kidney     Disease.  ETHANOL  Status: Abnormal   Collection Time    07/19/13  1:00 AM      Result Value Range   Alcohol, Ethyl (B) 111 (*) 0 - 11 mg/dL   Comment:            LOWEST DETECTABLE LIMIT FOR     SERUM ALCOHOL IS 11 mg/dL     FOR MEDICAL PURPOSES ONLY  SALICYLATE LEVEL     Status: Abnormal   Collection Time    07/19/13  1:00 AM      Result Value Range   Salicylate Lvl <2.0 (*) 2.8 - 20.0 mg/dL   Labs are reviewed and are pertinent for negative UDS/Etoh 111./K+ 2.8  Current Facility-Administered Medications  Medication Dose Route Frequency Provider Last Rate Last Dose  . FLUoxetine (PROZAC) capsule 20 mg  20 mg Oral Daily  Sunnie Nielsen, MD      . ibuprofen (ADVIL,MOTRIN) tablet 800 mg  800 mg Oral Once Sunnie Nielsen, MD      . methocarbamol (ROBAXIN) tablet 500 mg  500 mg Oral TID Sunnie Nielsen, MD      . sertraline (ZOLOFT) tablet 50 mg  50 mg Oral Daily Sunnie Nielsen, MD       Current Outpatient Prescriptions  Medication Sig Dispense Refill  . FLUoxetine (PROZAC) 20 MG capsule Take 1 capsule (20 mg total) by mouth daily. For depression and anxiety  30 capsule  0  . loratadine (CLARITIN) 10 MG tablet Take 1 tablet (10 mg total) by mouth daily. For allergy symptoms      . methocarbamol (ROBAXIN) 500 MG tablet Take one tablet by mouth every 8 hours, as needed for muscle spasms for 5 more days.      . penicillin v potassium (VEETID) 500 MG tablet Take 1 tablet (500 mg total) by mouth 4 (four) times daily.  40 tablet  0  . sertraline (ZOLOFT) 50 MG tablet Take 50 mg by mouth daily.      . traMADol (ULTRAM) 50 MG tablet Take 1 tablet (50 mg total) by mouth once.  15 tablet  0  . traZODone (DESYREL) 100 MG tablet Take 1 tablet (100 mg total) by mouth at bedtime as needed and may repeat dose one time if needed for sleep.  30 tablet  0    Psychiatric Specialty Exam:     Blood pressure 140/89, pulse 78, temperature 98.8 F (37.1 C), temperature source Oral, resp. rate 20, last menstrual period 06/18/2013, SpO2 100.00%.There is no weight on file to calculate BMI.  General Appearance: Disheveled  Eye Contact::  Good  Speech:  Clear and Coherent  Volume:  Normal  Mood:  Hopeless  Affect:  Congruent  Thought Process:  Intact  Orientation:  Full (Time, Place, and Person)  Thought Content:  self loathing evident along with the idea she just needs to be out of everybody's way but doesnt want to commit suicide  Suicidal Thoughts:  No  Homicidal Thoughts:  No  Memory:  Immediate;   Fair  Judgement:  Poor  Insight:  Lacking  Psychomotor Activity:  Normal  Concentration:  Poor  Recall:  Fair  Akathisia:  NA  Handed:   Right  AIMS (if indicated):     Assets:  Desire for Improvement  Sleep:      Treatment Plan Summary: Recommend detoxand retreatment for her chemical dependencies-continue current med(zoloft)  Addisyn Leclaire E 07/19/2013 2:41 AM   ADDENDUM: [facility] requests K+ be at 3.5-K+ ordered  And lab to be  repeated at noon 9/1-order for ACT team to be notified to reapply to [facility] when K= 3.5. Original fax request is in ED workroom.

## 2013-07-19 NOTE — ED Notes (Signed)
Report called to Tresa Endo RN pt to go to room 39

## 2013-07-19 NOTE — ED Notes (Signed)
Security called to wand patient 

## 2013-07-20 DIAGNOSIS — F411 Generalized anxiety disorder: Secondary | ICD-10-CM

## 2013-07-20 DIAGNOSIS — F329 Major depressive disorder, single episode, unspecified: Secondary | ICD-10-CM

## 2013-07-20 NOTE — Progress Notes (Signed)
Per psychiatrist pt is requesting to discharge home and follow up with outpatient resources. CSW  provided patient with outpatient resources. Patient stated she has her car here and is living in her car. Pt requested to shower before discharge. RN and EDP aware.   Frutoso Schatz [phone]  ED CSW 07/20/2013 8:36am

## 2013-07-20 NOTE — ED Provider Notes (Signed)
Pt requesting to go home, has been given outpt detox resources and will f/u with [facility].  Denies SI/HI.  I believe she is sfe for d/c, but has been given return precautions for new or worsening symptoms.   1. Polysubstance abuse   2. Opiate abuse, episodic      Shanna Cisco, MD 07/20/13 [phone]

## 2013-07-20 NOTE — Consult Note (Signed)
  Holly Castro says she does not want to go to rehab.  Says she can stop drinking on her own and has done so in the past.   Will follow up at [facility] because she is anxious and depressed and the Zoloft has not helped enough.  Will DC to outpatient detox and rehab.

## 2013-07-20 NOTE — ED Notes (Signed)
Pt. Is D/C'ed, wants to take a shower before she leaves.

## 2013-12-10 ENCOUNTER — Emergency Department (HOSPITAL_COMMUNITY)
Admission: EM | Admit: 2013-12-10 | Discharge: 2013-12-10 | Disposition: A | Discharge status: Home / Self Care | Payer: Self-pay | Attending: Emergency Medicine | Admitting: Emergency Medicine

## 2013-12-10 ENCOUNTER — Encounter (HOSPITAL_COMMUNITY): Payer: Self-pay | Admitting: Emergency Medicine

## 2013-12-10 DIAGNOSIS — F3289 Other specified depressive episodes: Secondary | ICD-10-CM | POA: Insufficient documentation

## 2013-12-10 DIAGNOSIS — Z8639 Personal history of other endocrine, nutritional and metabolic disease: Secondary | ICD-10-CM | POA: Insufficient documentation

## 2013-12-10 DIAGNOSIS — M545 Low back pain, unspecified: Secondary | ICD-10-CM | POA: Insufficient documentation

## 2013-12-10 DIAGNOSIS — F17200 Nicotine dependence, unspecified, uncomplicated: Secondary | ICD-10-CM | POA: Insufficient documentation

## 2013-12-10 DIAGNOSIS — F329 Major depressive disorder, single episode, unspecified: Secondary | ICD-10-CM | POA: Insufficient documentation

## 2013-12-10 DIAGNOSIS — Z862 Personal history of diseases of the blood and blood-forming organs and certain disorders involving the immune mechanism: Secondary | ICD-10-CM | POA: Insufficient documentation

## 2013-12-10 DIAGNOSIS — M549 Dorsalgia, unspecified: Secondary | ICD-10-CM

## 2013-12-10 DIAGNOSIS — R51 Headache: Secondary | ICD-10-CM | POA: Insufficient documentation

## 2013-12-10 DIAGNOSIS — K047 Periapical abscess without sinus: Secondary | ICD-10-CM | POA: Insufficient documentation

## 2013-12-10 DIAGNOSIS — R209 Unspecified disturbances of skin sensation: Secondary | ICD-10-CM | POA: Insufficient documentation

## 2013-12-10 DIAGNOSIS — G8929 Other chronic pain: Secondary | ICD-10-CM | POA: Insufficient documentation

## 2013-12-10 MED ORDER — TRAMADOL HCL 50 MG PO TABS: 50.0000 mg | ORAL_TABLET | Freq: Four times a day (QID) | ORAL | Status: DC | PRN

## 2013-12-10 MED ORDER — PENICILLIN V POTASSIUM 500 MG PO TABS: 500.0000 mg | ORAL_TABLET | Freq: Four times a day (QID) | ORAL | Status: AC

## 2013-12-10 NOTE — ED Provider Notes (Signed)
Medical screening examination/treatment/procedure(s) were performed by non-physician practitioner and as supervising physician I was immediately available for consultation/collaboration.  EKG Interpretation   None         Diania Co, MD 12/10/13 1539 

## 2013-12-10 NOTE — ED Provider Notes (Signed)
CSN: [id number]     Arrival date & time 12/10/13  1327 History   None   This chart was scribed for Sharilyn SitesLisa Cathlene Gardella PA-C, a non-physician practitioner working with Glynn OctaveStephen Rancour, MD by Lewanda RifeAlexandra Hurtado, ED Scribe. This patient was seen in room TR11C/TR11C and the patient's care was started at 1:54 PM      Chief Complaint  Patient presents with  . Dental Pain  . Back Pain   (Consider location/radiation/quality/duration/timing/severity/associated sxs/prior Treatment) The history is provided by the patient. No language interpreter was used.   HPI Comments: Holly Castro is a 33 y.o. female who presents to the Emergency Department complaining of constant worsening right upper dental pain onset 2 days. Reports associated headaches. Describes pain as aching. Denies associated fever, sore throat, or difficulty swallowing.  Pt is not currently established with a dentist.  Additionally, reports PMHx of herniated disc and chronic low back pain. Reports associated right sided low back pain exacerbated last night with no precipitating factors.  No injuries, traumas, or falls. Sx worse with movement.  Notes pain radiates down entire length of right leg associated with some mild paresthesias of right foot.  Denies numbness.  No loss of bowel or bladder control.  Past Medical History  Diagnosis Date  . Hypothyroidism   . Herniated disc 04/11/2012  . Depression    Past Surgical History  Procedure Laterality Date  . Eye surgery    . Tonsillectomy     History reviewed. No pertinent family history. History  Substance Use Topics  . Smoking status: Current Every Day Smoker -- 1.00 packs/day    Types: Cigarettes  . Smokeless tobacco: Never Used  . Alcohol Use: Yes     Comment: daily    OB History   Grav Para Term Preterm Abortions TAB SAB Ect Mult Living                 Review of Systems  Constitutional: Negative for fever.  HENT: Positive for dental problem. Negative for sore throat and trouble  swallowing.   Psychiatric/Behavioral: Negative for confusion.    Allergies  Review of patient's allergies indicates no known allergies.  Home Medications   Current Outpatient Rx  Name  Route  Sig  Dispense  Refill  . ibuprofen (ADVIL,MOTRIN) 200 MG tablet   Oral   Take 1,200 mg by mouth every 6 (six) hours as needed (For back pain.).         Marland Kitchen. sertraline (ZOLOFT) 100 MG tablet   Oral   Take 100 mg by mouth every morning.          BP 152/99  Pulse 86  Temp(Src) 98.8 F (37.1 C) (Oral)  Resp 18  Ht 5\' 7"  (1.702 m)  Wt 174 lb (78.926 kg)  BMI 27.25 kg/m2  SpO2 100%  Physical Exam  Nursing note and vitals reviewed. Constitutional: She is oriented to person, place, and time. She appears well-developed and well-nourished. No distress.  HENT:  Head: Normocephalic and atraumatic.  Mouth/Throat: Oropharynx is clear and moist.  Teeth largely in poor dentition, right upper molar broken with large cavity present, surrounding gingiva swollen and erythematous with small dental abscess present, handling secretions appropriately, no trismus; no difficulty swallowing  Eyes: Conjunctivae and EOM are normal. Pupils are equal, round, and reactive to light.  Neck: Normal range of motion. Neck supple.  Cardiovascular: Normal rate, regular rhythm and normal heart sounds.   Pulmonary/Chest: Effort normal and breath sounds normal. No respiratory distress. She  has no wheezes.  Musculoskeletal: Normal range of motion.       Lumbar back: She exhibits tenderness, bony tenderness and pain.       Back:  TTP of right SI joint without signs of deformity; full ROM maintained; distal sensation intact; normal gait  Neurological: She is alert and oriented to person, place, and time.  Skin: Skin is warm and dry. She is not diaphoretic.  Psychiatric: She has a normal mood and affect.    ED Course  Procedures  COORDINATION OF CARE:  Nursing notes reviewed. Vital signs reviewed. Initial pt  interview and examination performed.   1:58 PM-Discussed treatment plan with pt at bedside. Pt agrees with plan.   Treatment plan initiated:Medications - No data to display   Initial diagnostic testing ordered.     Labs Review Labs Reviewed - No data to display Imaging Review No results found.  EKG Interpretation   None       MDM   1. Dental abscess   2. Back pain    Right upper dental pain with small abscess present.  Back pain chronic without signs/sx concerning for cauda equina.  Will start on penicillin and tramadol.  FU with dentist-- referral and resource guide provided.  Discussed plan with pt, they agreed.  Return precautions advised.  I personally performed the services described in this documentation, which was scribed in my presence. The recorded information has been reviewed and is accurate.  Garlon Hatchet, PA-C 12/10/13 1428

## 2013-12-10 NOTE — ED Notes (Signed)
Pt reports pain on the top right side of her mouth. Thinks that she may have an abscess tooth. Also reports lower back pain with hx of herniated disc.

## 2013-12-10 NOTE — Discharge Instructions (Signed)
Take the prescribed medication as directed. Follow-up with dentist-- referral provided as well was resource guide below to help with this. Return to the ED for new or worsening symptoms.   Emergency Department Resource Guide 1) Find a Doctor and Pay Out of Pocket Although you won't have to find out who is covered by your insurance plan, it is a good idea to ask around and get recommendations. You will then need to call the office and see if the doctor you have chosen will accept you as a new patient and what types of options they offer for patients who are self-pay. Some doctors offer discounts or will set up payment plans for their patients who do not have insurance, but you will need to ask so you aren't surprised when you get to your appointment.  2) Contact Your Local Health Department Not all health departments have doctors that can see patients for sick visits, but many do, so it is worth a call to see if yours does. If you don't know where your local health department is, you can check in your phone book. The CDC also has a tool to help you locate your state's health department, and many state websites also have listings of all of their local health departments.  3) Find a Walk-in Clinic If your illness is not likely to be very severe or complicated, you may want to try a walk in clinic. These are popping up all over the country in pharmacies, drugstores, and shopping centers. They're usually staffed by nurse practitioners or physician assistants that have been trained to treat common illnesses and complaints. They're usually fairly quick and inexpensive. However, if you have serious medical issues or chronic medical problems, these are probably not your best option.  No Primary Care Doctor: - Call Health Connect at  [phone] - they can help you locate a primary care doctor that  accepts your insurance, provides certain services, etc. - Physician Referral Service- [phone]  Chronic  Pain Problems: Organization         Address  Phone   Notes  [facility]  [phone] Patients need to be referred by their primary care doctor.   Medication Assistance: Organization         Address  Phone   Notes  [facility] Medication [facility] [address] [place], [place] [address] [phone] --Must be a resident of [place] -- Must have NO insurance coverage whatsoever (no Medicaid/ Medicare, etc.) -- The pt. MUST have a primary care doctor that directs their care regularly and follows them in the community   MedAssist  [phone]   [employer]  [phone]    Agencies that provide inexpensive medical care: Organization         Address  Phone   Notes  [facility]  [phone]   [facility]    [phone]) [phone]   [facility] [address] [place], [place] [address] [phone]   [facility] [address], [place] [phone]) [phone]   [facility]anned [facility]    [phone]   [facility]    [phone]) [phone]   [facility]  [address], [place] Phone:  [phone], Fax:  [phone] Hours of Operation:  9 am - 6 pm, M-F.  Also accepts Medicaid/Medicare and self-pay.  [facility]  [address], [place] Phone: [phone], Fax: [phone]. Hours of Operation:  8:30 am - 5:30 pm, M-F.  Also accepts Medicaid and self-pay.  [facility] [place] [address], [place] Phone: [phone]   [place], [place], [place] [phone], Ext. [phone] Mondays & Thursdays: 7-9 AM.  First 15 patients are seen on a first come, first serve basis.    [place] Providers:  Organization         Address  Phone   Notes  [facility] [address], [place] [phone] Also  accepts self-pay patients.  [facility] [address], [place]  [phone]   [place], [address], [place] [phone]   [facility] [address] [place] [phone]   Lucianne Lei [address], [place]   [phone] Only accepts [place] Access [place] patients after they have their name applied to their card.   Self-Pay (no insurance) in [facility]:  Organization         Address  Phone   Notes  Sickle Cell Patients, [facility] [employer] [phone]   [facility] [place] [phone]   [facility] [place]  [place], [place], [place] [phone]   [facility]/Dr. Osei-Bonsu  [address] [place] or [address], [place] [phone] Phone number for both [place] and [place] locations is the same.  [facility] [address], [place] [phone]   [facility] [address] [place] or [address] [phone]   [facility] [address] [place] [phone], phone; [phone], fax Sees patients 1st and 3rd Saturday of every month.  Must not qualify for public or private insurance (i.e. Medicaid, Medicare, [place] Health Choice, Veterans' Benefits)  Household income should be no more than 200% of the poverty level The clinic cannot treat you if you are pregnant or think you are pregnant  Sexually transmitted diseases are not treated at the clinic.    Dental Care: Organization         Address  Phone  Notes  [facility] of [facility] [phone] Accepts children up to age 58 who are enrolled in [place] or [place]; pregnant  women with a Medicaid card; and children who have applied for Medicaid or [place] Health Choice, but were declined, whose parents can pay a reduced fee at time of service.  [facility] Department of [facility]  [address], [place] [phone] Accepts children up to age 52 who are enrolled in [place] or [place]; pregnant women with a Medicaid card; and children who have applied for Medicaid or [place] Health Choice, but were declined, whose parents can pay a reduced fee at time of service.  [facility] Access PROGRAM  [place] [phone] Patients are seen by appointment only. Walk-ins are not accepted. [place] will see patients 8 years of age and older. Monday - Tuesday (8am-5pm) Most Wednesdays (8:30-5pm) $30 per visit, cash only  [place]  PROGRAM  [address], [facility] [phone] Patients are seen by appointment only. Walk-ins are not accepted. [place] will see patients 73 years of age and older. One Wednesday Evening (Monthly: Volunteer Based).  $30 per visit, cash only  [place]  [phone] for adults; Children under age 67, call Graduate Pediatric Dentistry at [phone]. Children aged 69-14, please call [phone] to request a pediatric application.  Dental services are provided in all areas of dental care including fillings, crowns and bridges, complete and partial dentures, implants, gum treatment, root canals, and extractions. Preventive care is also provided. Treatment is provided to both adults and children. Patients are selected via a lottery and there is often a waiting list.   [facility] [address], [place]  [phone] [email or web address]   [facility] [address] [place], [place] [phone], Ext. [phone] Second and Fourth Thursday of each month, opens at 6:30 AM; Clinic ends at 9 AM.  Patients are  seen on a first-come first-served basis, and a limited number are seen during each clinic.   [facility]  [address] [place], [place] [phone]   Eligibility Requirements You must have lived in [place], [place], or [place] counties for at least the last three months.   You cannot be eligible for state or federal sponsored [employer], including [employer], [place], or [employer].   You generally cannot be eligible for healthcare insurance through your employer.    How to apply: Eligibility screenings are held every Tuesday and Wednesday afternoon from 1:00 pm until 4:00 pm. You do not need an appointment for the interview!  [facility] [address], [place], [place]   [place] Department  [place]  [phone]    Behavioral Health Resources in the Community: Intensive Outpatient Programs Organization         Address  Phone  Notes  [place]. [address], [place], [place] [phone]   [facility] Outpatient [address] [place], [place]   ADS: Alcohol & Drug Svcs [address], [place], [place]   [place] [address]  [place], [place] or [phone]   Substance Abuse Resources Organization         Address  Phone  Notes  Alcohol and Drug Services  [phone]   [place]  [phone]   [facility] [place]   Chinita Pester  [phone]   Residential & Outpatient Substance Abuse Program  [phone]   Psychological Services Organization         Address  Phone  Notes  [facility] [place]  [place]  [phone]   [place] [address], [place] or [phone]    Mobile Crisis  Teams Organization         Address  Phone  Notes  [facility], Mobile Crisis Care Unit  [phone]   [facility]  [address] [place], [place]   [place] Levels [address], [place] [phone]    Self-Help/Support Groups Organization         Address  Phone             Notes  [place]  of [place] - variety of support groups  [phone] Call for more information  Narcotics Anonymous (NA), [facility]aring [facility] [address], [place]  2 meetings at this location   Statisticianesidential Treatment Programs Organization         Address  Phone  Notes  [facility] [address],    [place]  [phone]   [facility]  [address], [place] [phone], [place], [place] [phone]   [facility] [address], [place] [phone] Admissions: 8am-3pm M-F  [facility] [address]    [place], [place] [phone]   [facility] [address], [place], [place] [phone]   [facility] [address]  [place], [place] [phone]   [facility] - Intensive Outpatient [address], [place], [place] [phone]   [facility] ([facility] [address]  [place], [place] [phone] or [phone]   [facility] (RTS) [address] [place], [place] [phone] Accepts Medicaid  [facility] [address]  [place] [phone] Substance Abuse/Addiction Treatment   [facility] Behavioral Health Resources Organization         Address  Phone  Notes  [facility]  [phone]   Angie FavaJulie Brannon, PhD [address], [address] [place], [place]   [phone] or [phone]   [facility]   [address] [place], [place] [phone]   [facility] [address], [place], [place] [phone]  Insurance/Medicaid/sponsorship through [facility]  Faith and Families [address]                                    [place], [place] [phone]) [phone] Therapy/tele-psych/case  [facility] [address]   [place], [place] [phone](336) [phone]    Dr. Lolly MustacheArfeen  [phone]) [phone]   [facility] [place]  [employer] [facility] 1) [address], [place] 2) [address], [place] 3)  [address], [place] [phone](336) [phone]  [phone]) [phone]   [facility] Child Abuse Hotline [phone]) [phone] or [phone](336) [phone] (After Hours)

## 2014-04-17 ENCOUNTER — Emergency Department (HOSPITAL_COMMUNITY)
Admission: EM | Admit: 2014-04-17 | Discharge: 2014-04-17 | Disposition: A | Discharge status: Home / Self Care | Payer: Self-pay | Attending: Emergency Medicine | Admitting: Emergency Medicine

## 2014-04-17 ENCOUNTER — Encounter (HOSPITAL_COMMUNITY): Payer: Self-pay | Admitting: Emergency Medicine

## 2014-04-17 DIAGNOSIS — Z862 Personal history of diseases of the blood and blood-forming organs and certain disorders involving the immune mechanism: Secondary | ICD-10-CM | POA: Insufficient documentation

## 2014-04-17 DIAGNOSIS — F411 Generalized anxiety disorder: Secondary | ICD-10-CM | POA: Insufficient documentation

## 2014-04-17 DIAGNOSIS — F329 Major depressive disorder, single episode, unspecified: Secondary | ICD-10-CM | POA: Insufficient documentation

## 2014-04-17 DIAGNOSIS — M549 Dorsalgia, unspecified: Secondary | ICD-10-CM | POA: Insufficient documentation

## 2014-04-17 DIAGNOSIS — F17200 Nicotine dependence, unspecified, uncomplicated: Secondary | ICD-10-CM | POA: Insufficient documentation

## 2014-04-17 DIAGNOSIS — F3289 Other specified depressive episodes: Secondary | ICD-10-CM | POA: Insufficient documentation

## 2014-04-17 DIAGNOSIS — R609 Edema, unspecified: Secondary | ICD-10-CM | POA: Insufficient documentation

## 2014-04-17 DIAGNOSIS — R600 Localized edema: Secondary | ICD-10-CM

## 2014-04-17 DIAGNOSIS — R209 Unspecified disturbances of skin sensation: Secondary | ICD-10-CM | POA: Insufficient documentation

## 2014-04-17 DIAGNOSIS — Z79899 Other long term (current) drug therapy: Secondary | ICD-10-CM | POA: Insufficient documentation

## 2014-04-17 DIAGNOSIS — Z8639 Personal history of other endocrine, nutritional and metabolic disease: Secondary | ICD-10-CM | POA: Insufficient documentation

## 2014-04-17 HISTORY — DX: Anxiety disorder, unspecified: F41.9

## 2014-04-17 NOTE — ED Provider Notes (Signed)
CSN: [id number]     Arrival date & time 04/17/14  1723 History  This chart was scribed for Junius Finner, PA-C, non-physician practitioner working with Suzi Roots, MD by Nicholos Johns, ED scribe. This patient was seen in room WTR7/WTR7 and the patient's care was started at 6:07 PM.   Chief Complaint  Patient presents with  . Leg Pain  . Back Pain    The history is provided by the patient. No language interpreter was used.   HPI Comments: Holly Castro is a 33 y.o. female who presents to the Emergency Department complaining of gradually worsening lower leg swelling w/ associated pain and  tingling, onset 1 day ago. Says her legs feel like they are going to pop. Has been taking temperature for the past 2 days and states it has been right around 100 degrees; denies fever today. States this has never occurred before. Also reports some mild back pain. Regularly takes ibuprofen for a herniated disc in her back. Denies recent long travel. No hx of blood clots or heart trouble. Never been on a fluid pill before. Denies use of new soaps or detergents. Family hx of diabetes. Takes Buspar for anxiety and Zoloft for depression. Denies fever, nausea, vomiting, chest pain, SOB.  Past Medical History  Diagnosis Date  . Hypothyroidism   . Herniated disc 04/11/2012  . Depression   . Anxiety    Past Surgical History  Procedure Laterality Date  . Eye surgery    . Tonsillectomy     No family history on file. History  Substance Use Topics  . Smoking status: Current Every Day Smoker -- 1.00 packs/day    Types: Cigarettes  . Smokeless tobacco: Never Used  . Alcohol Use: Yes     Comment: daily    OB History   Grav Para Term Preterm Abortions TAB SAB Ect Mult Living                 Review of Systems  Respiratory: Negative for shortness of breath.   Cardiovascular: Positive for leg swelling.  Gastrointestinal: Negative for nausea and vomiting.  All other systems reviewed and are  negative.  Allergies  Review of patient's allergies indicates no known allergies.  Home Medications   Prior to Admission medications   Medication Sig Start Date End Date Taking? Authorizing Provider  acetaminophen (TYLENOL) 500 MG tablet Take 1,500 mg by mouth every 6 (six) hours as needed for mild pain or headache.    Historical Provider, MD  busPIRone (BUSPAR) 10 MG tablet Take 10 mg by mouth 2 (two) times daily.    Historical Provider, MD  ibuprofen (ADVIL,MOTRIN) 200 MG tablet Take 1,200 mg by mouth every 6 (six) hours as needed (For back pain.).    Historical Provider, MD  sertraline (ZOLOFT) 100 MG tablet Take 100 mg by mouth every morning.    Historical Provider, MD  traMADol (ULTRAM) 50 MG tablet Take 1 tablet (50 mg total) by mouth every 6 (six) hours as needed. 12/10/13   Garlon Hatchet, PA-C   Triage Vitals: BP 110/71  Pulse 117  Temp(Src) 98.3 F (36.8 C) (Oral)  Resp 20  SpO2 95%  Physical Exam  Nursing note and vitals reviewed. Constitutional: She is oriented to person, place, and time. She appears well-developed and well-nourished.  HENT:  Head: Normocephalic and atraumatic.  Eyes: EOM are normal.  Neck: Normal range of motion.  Cardiovascular: Normal rate, regular rhythm and normal heart sounds.   No murmur heard.  Pulses:      Dorsalis pedis pulses are 2+ on the right side, and 2+ on the left side.  Pulmonary/Chest: Effort normal and breath sounds normal. She has no wheezes.  Musculoskeletal: Normal range of motion.  Bilateral 2+ pitting edema with overlying dry skin. Tenderness to both ankles. Full ROM. Mild calf tenderness bilaterally. Pedal pulses 2+.  Neurological: She is alert and oriented to person, place, and time.  Clinically intoxicated.  Skin: Skin is warm and dry.  Psychiatric: She has a normal mood and affect. Her behavior is normal.    ED Course  Procedures (including critical care time DIAGNOSTIC STUDIES: Oxygen Saturation is 95% on room air,  normal by my interpretation.    COORDINATION OF CARE: At 6:12 PM: Discussed pt with Dr. Denton LankSteinl, will get basic lab work. Low concern for DVT.    6:26 PM Lab tech went to draw blood from pt, pt refused stating she does not want to wait for lab work. States she wants to go home and will elevate her feet at home.    Labs Review Labs Reviewed - No data to display  Imaging Review No results found.   EKG Interpretation None      MDM   Final diagnoses:  Bilateral leg edema   Pt presenting to ED with husband c/o bilateral leg edema. Pt appears clinically intoxicated but A&O x3.  Pt does have hx of polysubstance abuse. Denies known injury to legs. No hx of DVTs or gout.  Lab work offered but pt declined. Not concerned for emergent process taking place at this time. Pt may be discharged home.  Advised pt to f/u with PCP. Return precautions provided. Pt verbalized understanding and agreement with tx plan.   I personally performed the services described in this documentation, which was scribed in my presence. The recorded information has been reviewed and is accurate.      Junius Finnerrin O'Malley, PA-C 04/17/14 [phone]

## 2014-04-17 NOTE — ED Notes (Signed)
Pt c/o bilateral ankle swelling and lower leg pain since yesterday. Pt also c/o mid back pain. Pt arrives to exam room in wheelchair. Pt has slurred speech. Pt alert, no acute distress. Skin warm and dry.

## 2014-04-17 NOTE — ED Notes (Signed)
Patient refused blood draw for labs.RN Chere made aware

## 2014-04-17 NOTE — ED Provider Notes (Signed)
Medical screening examination/treatment/procedure(s) were performed by non-physician practitioner and as supervising physician I was immediately available for consultation/collaboration.    Thadius Smisek E Levaughn Puccinelli, MD 04/17/14 2346 

## 2014-04-17 NOTE — Discharge Instructions (Signed)

## 2014-06-07 ENCOUNTER — Emergency Department (HOSPITAL_COMMUNITY): Payer: Self-pay

## 2014-06-07 ENCOUNTER — Encounter (HOSPITAL_COMMUNITY): Payer: Self-pay | Admitting: Emergency Medicine

## 2014-06-07 ENCOUNTER — Emergency Department (HOSPITAL_COMMUNITY)
Admission: EM | Admit: 2014-06-07 | Discharge: 2014-06-07 | Disposition: A | Discharge status: Home / Self Care | Payer: Self-pay | Attending: Emergency Medicine | Admitting: Emergency Medicine

## 2014-06-07 DIAGNOSIS — F329 Major depressive disorder, single episode, unspecified: Secondary | ICD-10-CM | POA: Insufficient documentation

## 2014-06-07 DIAGNOSIS — Z3202 Encounter for pregnancy test, result negative: Secondary | ICD-10-CM | POA: Insufficient documentation

## 2014-06-07 DIAGNOSIS — R000 Tachycardia, unspecified: Secondary | ICD-10-CM | POA: Insufficient documentation

## 2014-06-07 DIAGNOSIS — Z862 Personal history of diseases of the blood and blood-forming organs and certain disorders involving the immune mechanism: Secondary | ICD-10-CM | POA: Insufficient documentation

## 2014-06-07 DIAGNOSIS — Z8639 Personal history of other endocrine, nutritional and metabolic disease: Secondary | ICD-10-CM | POA: Insufficient documentation

## 2014-06-07 DIAGNOSIS — F411 Generalized anxiety disorder: Secondary | ICD-10-CM | POA: Insufficient documentation

## 2014-06-07 DIAGNOSIS — Z8739 Personal history of other diseases of the musculoskeletal system and connective tissue: Secondary | ICD-10-CM | POA: Insufficient documentation

## 2014-06-07 DIAGNOSIS — F17200 Nicotine dependence, unspecified, uncomplicated: Secondary | ICD-10-CM | POA: Insufficient documentation

## 2014-06-07 DIAGNOSIS — F3289 Other specified depressive episodes: Secondary | ICD-10-CM | POA: Insufficient documentation

## 2014-06-07 DIAGNOSIS — N12 Tubulo-interstitial nephritis, not specified as acute or chronic: Secondary | ICD-10-CM | POA: Insufficient documentation

## 2014-06-07 LAB — CBC
HCT: 45.9 % (ref 36.0–46.0)
Hemoglobin: 15.6 g/dL — ABNORMAL HIGH (ref 12.0–15.0)
MCH: 32.2 pg (ref 26.0–34.0)
MCHC: 34 g/dL (ref 30.0–36.0)
MCV: 94.6 fL (ref 78.0–100.0)
PLATELETS: 220 10*3/uL (ref 150–400)
RBC: 4.85 MIL/uL (ref 3.87–5.11)
RDW: 14.7 % (ref 11.5–15.5)
WBC: 12.7 10*3/uL — AB (ref 4.0–10.5)

## 2014-06-07 LAB — COMPREHENSIVE METABOLIC PANEL
ALT: 8 U/L (ref 0–35)
ANION GAP: 12 (ref 5–15)
AST: 12 U/L (ref 0–37)
Albumin: 3.2 g/dL — ABNORMAL LOW (ref 3.5–5.2)
Alkaline Phosphatase: 97 U/L (ref 39–117)
BILIRUBIN TOTAL: 0.7 mg/dL (ref 0.3–1.2)
BUN: 5 mg/dL — AB (ref 6–23)
CALCIUM: 9 mg/dL (ref 8.4–10.5)
CHLORIDE: 98 meq/L (ref 96–112)
CO2: 27 meq/L (ref 19–32)
CREATININE: 0.66 mg/dL (ref 0.50–1.10)
GFR calc Af Amer: >90 mL/min (ref >90)
Glucose, Bld: 101 mg/dL — ABNORMAL HIGH (ref 70–99)
Potassium: 3.4 mEq/L — ABNORMAL LOW (ref 3.7–5.3)
Sodium: 137 mEq/L (ref 137–147)
Total Protein: 7.4 g/dL (ref 6.0–8.3)

## 2014-06-07 LAB — URINE MICROSCOPIC-ADD ON

## 2014-06-07 LAB — URINALYSIS, ROUTINE W REFLEX MICROSCOPIC
Glucose, UA: NEGATIVE mg/dL
Hgb urine dipstick: NEGATIVE
Ketones, ur: NEGATIVE mg/dL
NITRITE: POSITIVE — AB
Protein, ur: NEGATIVE mg/dL
Specific Gravity, Urine: 1.019 (ref 1.005–1.030)
pH: 6 (ref 5.0–8.0)

## 2014-06-07 LAB — PREGNANCY, URINE: Preg Test, Ur: NEGATIVE

## 2014-06-07 LAB — I-STAT CG4 LACTIC ACID, ED: Lactic Acid, Venous: 1.89 mmol/L (ref 0.5–2.2)

## 2014-06-07 LAB — LIPASE, BLOOD: LIPASE: 29 U/L (ref 11–59)

## 2014-06-07 MED ORDER — FENTANYL CITRATE 0.05 MG/ML IJ SOLN
50.0000 ug | Freq: Once | INTRAMUSCULAR | Status: AC
Start: 2014-06-07 — End: 2014-06-07
  Administered 2014-06-07: 50 ug via INTRAVENOUS
  Filled 2014-06-07: qty 2

## 2014-06-07 MED ORDER — PROMETHAZINE HCL 25 MG PO TABS: 25.0000 mg | ORAL_TABLET | Freq: Four times a day (QID) | ORAL | Status: AC | PRN

## 2014-06-07 MED ORDER — SODIUM CHLORIDE 0.9 % IV BOLUS (SEPSIS)
1000.0000 mL | Freq: Once | INTRAVENOUS | Status: AC
  Administered 2014-06-07: 1000 mL via INTRAVENOUS

## 2014-06-07 MED ORDER — DEXTROSE 5 % IV SOLN
1.0000 g | Freq: Once | INTRAVENOUS | Status: AC
  Administered 2014-06-07: 1 g via INTRAVENOUS
  Filled 2014-06-07: qty 10

## 2014-06-07 MED ORDER — ONDANSETRON HCL 4 MG/2ML IJ SOLN
4.0000 mg | Freq: Once | INTRAMUSCULAR | Status: AC
  Administered 2014-06-07: 4 mg via INTRAVENOUS
  Filled 2014-06-07: qty 2

## 2014-06-07 MED ORDER — MORPHINE SULFATE 4 MG/ML IJ SOLN
4.0000 mg | Freq: Once | INTRAMUSCULAR | Status: AC
  Administered 2014-06-07: 4 mg via INTRAVENOUS
  Filled 2014-06-07: qty 1

## 2014-06-07 MED ORDER — CEPHALEXIN 500 MG PO CAPS: 500.0000 mg | ORAL_CAPSULE | Freq: Four times a day (QID) | ORAL | Status: AC

## 2014-06-07 MED ORDER — FENTANYL CITRATE 0.05 MG/ML IJ SOLN
50.0000 ug | Freq: Once | INTRAMUSCULAR | Status: AC
  Administered 2014-06-07: 50 ug via INTRAVENOUS
  Filled 2014-06-07: qty 2

## 2014-06-07 MED ORDER — HYDROCODONE-ACETAMINOPHEN 5-325 MG PO TABS: 1.0000 | ORAL_TABLET | Freq: Four times a day (QID) | ORAL | Status: AC | PRN

## 2014-06-07 NOTE — ED Provider Notes (Signed)
CSN: [id number]     Arrival date & time 06/07/14  1425 History   First MD Initiated Contact with Patient 06/07/14 1506     Chief Complaint  Patient presents with  . Flank Pain    right     (Consider location/radiation/quality/duration/timing/severity/associated sxs/prior Treatment) HPI Comments: Patient is a 33 year old female with history of hypothyroidism, herniated disc, depression and anxiety who presents today with right flank pain. She states that this began yesterday. It is a tingling, burning pain. Palpation makes her pain worse. She had associated nausea and vomiting. She has not vomited today. She has diarrhea, but this is not new. She has increase in urinary frequency. No dysuria, vaginal discharge. 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  The history is provided by the patient. No language interpreter was used.    Past Medical History  Diagnosis Date  . Hypothyroidism   . Herniated disc 04/11/2012  . Depression   . Anxiety    Past Surgical History  Procedure Laterality Date  . Eye surgery    . Tonsillectomy     No family history on file. History  Substance Use Topics  . Smoking status: Current Every Day Smoker -- 1.00 packs/day    Types: Cigarettes  . Smokeless tobacco: Never Used  . Alcohol Use: Yes     Comment: daily    OB History   Grav Para Term Preterm Abortions TAB SAB Ect Mult Living                 Review of Systems  Constitutional: Negative for fever and chills.  Gastrointestinal: Positive for nausea, vomiting and abdominal pain.  Genitourinary: Positive for frequency and flank pain. Negative for dysuria, vaginal bleeding and vaginal discharge.  All other systems reviewed and are negative.     Allergies  Review of patient's allergies indicates no known allergies.  Home Medications   Prior to Admission medications   Medication Sig Start Date End Date Taking? Authorizing Provider  ibuprofen (ADVIL,MOTRIN) 200 MG tablet Take 800 mg by mouth every  6 (six) hours as needed for moderate pain (For back pain.).    Yes Historical Provider, MD  traMADol (ULTRAM) 50 MG tablet Take 50 mg by mouth every 6 (six) hours as needed for moderate pain.   Yes Historical Provider, MD   BP 128/88  Pulse 100  Temp(Src) 98.3 F (36.8 C) (Oral)  Resp 12  SpO2 100%  LMP 06/03/2014 Physical Exam  Nursing note and vitals reviewed. Constitutional: She is oriented to person, place, and time. She appears well-developed and well-nourished. No distress.  HENT:  Head: Normocephalic and atraumatic.  Right Ear: External ear normal.  Left Ear: External ear normal.  Nose: Nose normal.  Mouth/Throat: Oropharynx is clear and moist.  Eyes: Conjunctivae are normal.  Neck: Normal range of motion.  Cardiovascular: Regular rhythm and normal heart sounds.  Tachycardia present.   Pulmonary/Chest: Effort normal and breath sounds normal. No stridor. No respiratory distress. She has no wheezes. She has no rales.  Abdominal: Soft. She exhibits no distension. There is tenderness. There is CVA tenderness (right).    Musculoskeletal: Normal range of motion.       Back:  Neurological: She is alert and oriented to person, place, and time. She has normal strength.  Skin: Skin is warm and dry. She is not diaphoretic. No erythema.  Psychiatric: She has a normal mood and affect. Her behavior is normal.    ED Course  Procedures (including critical care time)  Labs Review Labs Reviewed  COMPREHENSIVE METABOLIC PANEL - Abnormal; Notable for the following:    Potassium 3.4 (*)    Glucose, Bld 101 (*)    BUN 5 (*)    Albumin 3.2 (*)    All other components within normal limits  CBC - Abnormal; Notable for the following:    WBC 12.7 (*)    Hemoglobin 15.6 (*)    All other components within normal limits  URINALYSIS, ROUTINE W REFLEX MICROSCOPIC - Abnormal; Notable for the following:    Color, Urine AMBER (*)    APPearance CLOUDY (*)    Bilirubin Urine SMALL (*)     Urobilinogen, UA >8.0 (*)    Nitrite POSITIVE (*)    Leukocytes, UA LARGE (*)    All other components within normal limits  URINE MICROSCOPIC-ADD ON - Abnormal; Notable for the following:    Bacteria, UA MANY (*)    All other components within normal limits  URINE CULTURE  LIPASE, BLOOD  PREGNANCY, URINE  I-STAT CG4 LACTIC ACID, ED    Imaging Review Ct Abdomen Pelvis Wo Contrast  06/07/2014   CLINICAL DATA:  Right-sided flank pain.  EXAM: CT ABDOMEN AND PELVIS WITHOUT CONTRAST  TECHNIQUE: Multidetector CT imaging of the abdomen and pelvis was performed following the standard protocol without IV contrast.  COMPARISON:  No priors.  FINDINGS: Lung Bases: Unremarkable.  Abdomen/Pelvis: No abnormal calculi are noted within the collecting system of either kidney, along the course of either ureter or within the lumen of the urinary bladder. There is, however, mild right hydroureteronephrosis and extensive perinephric stranding. The unenhanced appearance of the kidneys is otherwise unremarkable.  Diffuse low attenuation throughout the hepatic parenchyma, compatible with hepatic steatosis. At least 2 rim calcified gallstones are present within the gallbladder, largest of which is in the neck measuring 19 mm in diameter. No current findings to suggest an acute cholecystitis at this time. The unenhanced appearance of the pancreas, spleen and bilateral adrenal glands is unremarkable. No significant volume of ascites. No pneumoperitoneum. No pathologic distention of small bowel. Normal appendix. No definite lymphadenopathy identified within the abdomen or pelvis on today's non contrast CT examination. Uterus and ovaries are unremarkable in appearance. Urinary bladder is normal in appearance.  Musculoskeletal: There are no aggressive appearing lytic or blastic lesions noted in the visualized portions of the skeleton.  IMPRESSION: 1. No urinary tract calculi noted. 2. There is mild right hydroureteronephrosis and  extensive right-sided perinephric stranding. This may simply reflect an upper tract urinary infection such as pyelonephritis, and correlation with urinalysis is recommended. Alternatively, similar findings could be seen in the setting of a recently passed calculus. 3. Hepatic steatosis. 4. Cholelithiasis without evidence to suggest acute cholecystitis at this time. 5. Normal appendix.   Electronically Signed   By: Trudie Reedaniel  Entrikin M.D.   On: 06/07/2014 16:52     EKG Interpretation   Date/Time:  Tuesday June 07 2014 15:36:07 EDT Ventricular Rate:  115 PR Interval:  121 QRS Duration: 80 QT Interval:  349 QTC Calculation: 483 R Axis:   82 Text Interpretation:  Sinus tachycardia Nonspecific T abnormalities,  anterior leads Borderline prolonged QT interval No old tracing to compare  Confirmed by GOLDSTON  MD, SCOTT ([id number]) on 06/07/2014 3:38:21 PM      MDM   Final diagnoses:  Pyelonephritis   Patient presents to ED with pyelonephritis. UA shows large leukocytes and positive nitrates. CVA tenderness. Urine culture pending. No infected stone seen on CT. She  was initially tachycardic which improved with 2 L of fluid. Patient tolerated PO fluid in ED. She was given IV rocephin and discharged home with Keflex. Discussed reasons to return to ED immediately. Patient is non septic, non toxic in appearance. Patient / Family / Caregiver informed of clinical course, understand medical decision-making process, and agree with plan.     Mora Bellman, PA-C 06/08/14 1731

## 2014-06-07 NOTE — ED Notes (Signed)
Pt c/o right back pain tat wraps around to right flank, no hx of kidney stones. Pt does have a herniated disc. Pt c/o nausea but no vomiting today.

## 2014-06-07 NOTE — Progress Notes (Signed)
[facility] provided pt with a list of primary care resources and a [facility] Orange Card application to help patient establish a pcp.  °

## 2014-06-07 NOTE — Discharge Instructions (Signed)
Pyelonephritis, Adult °Pyelonephritis is a kidney infection. A kidney infection can happen quickly, or it can last for a long time. °HOME CARE  °· Take your medicine (antibiotics) as told. Finish it even if you start to feel better. °· Keep all doctor visits as told. °· Drink enough fluids to keep your pee (urine) clear or pale yellow. °· Only take medicine as told by your doctor. °GET HELP RIGHT AWAY IF:  °· You have a fever or lasting symptoms for more than 2-3 days. °· You have a fever and your symptoms suddenly get worse. °· You cannot take your medicine or drink fluids as told. °· You have chills and shaking. °· You feel very weak or pass out (faint). °· You do not feel better after 2 days. °MAKE SURE YOU: °· Understand these instructions. °· Will watch your condition. °· Will get help right away if you are not doing well or get worse. °Document Released: 12/12/2004 Document Revised: 05/05/2012 Document Reviewed: 04/24/2011 °ExitCare® Patient Information ©2015 [employer]. This information is not intended to replace advice given to you by your health care provider. Make sure you discuss any questions you have with your health care provider. ° ° °Emergency Department Resource Guide °1) Find a Doctor and Pay Out of Pocket °Although you won't have to find out who is covered by your insurance plan, it is a good idea to ask around and get recommendations. You will then need to call the office and see if the doctor you have chosen will accept you as a new patient and what types of options they offer for patients who are self-pay. Some doctors offer discounts or will set up payment plans for their patients who do not have insurance, but you will need to ask so you aren't surprised when you get to your appointment. ° °2) Contact Your Local Health Department °Not all health departments have doctors that can see patients for sick visits, but many do, so it is worth a call to see if yours does. If you don't know where  your local health department is, you can check in your phone book. The CDC also has a tool to help you locate your state's health department, and many state websites also have listings of all of their local health departments. ° °3) Find a Walk-in Clinic °If your illness is not likely to be very severe or complicated, you may want to try a walk in clinic. These are popping up all over the country in pharmacies, drugstores, and shopping centers. They're usually staffed by nurse practitioners or physician assistants that have been trained to treat common illnesses and complaints. They're usually fairly quick and inexpensive. However, if you have serious medical issues or chronic medical problems, these are probably not your best option. ° °No Primary Care Doctor: °- Call Health Connect at  832-8000 - they can help you locate a primary care doctor that  accepts your insurance, provides certain services, etc. °- Physician Referral Service- 1-800-533-3463 ° °Chronic Pain Problems: °Organization         Address  Phone   Notes  °[facility]  (336) 297-2271 Patients need to be referred by their primary care doctor.  ° °Medication Assistance: °Organization         Address  Phone   Notes  °[place] [facility] [address] °[place], [place] [address] (336) 641-8030 --Must be a resident of [place] °-- Must have NO insurance   coverage whatsoever (no Medicaid/ Medicare, etc.) °-- The pt. MUST have a primary care doctor that directs their care regularly and follows them in the community °  °[facility]  (866) 331-1348   °[employer]  (888) 892-1162   ° °Agencies that provide inexpensive medical care: °Organization         Address  Phone   Notes  °[facility]  (336) 832-8035   °[facility]    (336) 832-7272   °[facility] [address] °[place], [place] [address] (336) 832-4777   °[facility] [address], °[place] (336) 271-4999   °[facility]    (336) 373-0678   °[facility]    (336) 272-1050   °[facility] ° [address], [place] Phone:  (336) 832-4444, Fax:  (336) 832-4440 Hours of Operation:  9 am - 6 pm, M-F.  Also accepts Medicaid/Medicare and self-pay.  °[facility] ° [address], [place] Phone: (336) 832-3150, Fax: (336) 832-3151. Hours of Operation:  8:30 am - 5:30 pm, M-F.  Also accepts Medicaid and self-pay.  °[facility] [place] [address], [place] Phone: (336) 878-6027   °[facility] [address], [place], [place] (336)723-1848, Ext. 123 Mondays & Thursdays: 7-9 AM.  First 15 patients are seen on a first come, first serve basis. °  ° °Medicaid-accepting [place] Providers: ° °Organization         Address  Phone   Notes  °[facility] [address], [place] (336) 641-2100 Also accepts self-pay patients.  °[facility] [address], [place] ° (336) 856-9996   °[facility] [address], [place] (336) 288-8857   °[facility] [address] [place] [address], [place] (336) 299-7000   °Veita Bland [address], [place]  ° (336) 373-1557 Only accepts [place] Access Medicaid patients after they have their name applied to their card.  ° °Self-Pay (no insurance) in [place]: ° °Organization         Address  Phone   Notes  °Sickle Cell Patients, [facility] [address], [place] (336) 832-1970   °[facility] [address], [place] (336) 832-4400   °[facility] ° [address], [place] (336) 992-4800   °[facility]/Dr. Osei-Bonsu ° [address] [place] [address], [place] or [address], [place] (336) 841-8500 Phone number for both [place] and [place] locations is the same.  °[facility] [address], [place] (336) 299-0000   °[facility]  [address] [place] [address],  or [address] (336) 852-7530 °(336) 878-2260   °[facility] [address],  (336) 350-1642, phone; (336) 294-5005, fax Sees patients 1st and 3rd Saturday of every month.  Must not qualify for public or private insurance (i.e. Medicaid, Medicare, [place] Health Choice, Veterans' Benefits) • Household income should be no more than 200% of the poverty level •The clinic cannot treat you if you are pregnant or think you are pregnant • Sexually transmitted diseases are not treated at the clinic.  ° ° °Dental Care: °Organization         Address  Phone  Notes  °[place] [facility] [address], [place] (336) 641-6152 Accepts children up to age 21 who are enrolled in Medicaid or [place] Health Choice; pregnant women with a Medicaid card; and children who have applied for Medicaid or [place] Health Choice, but were declined, whose parents can pay a reduced fee at time of service.  °[facility] [place]  [address], [place] (336) 641-7733 Accepts children up to age 21 who are enrolled in Medicaid or [place] Health Choice; pregnant women with a Medicaid card; and children who have applied for Medicaid or [place] Health Choice, but were declined, whose parents can pay a reduced fee at time of service.  °[facility] ° [address], [place] (336) 641-4533 Patients are seen by appointment only. Walk-ins are not accepted. [facility] will see patients 18 years of age and older. °Monday - Tuesday (8am-5pm) °Most Wednesdays (8:30-5pm) °$30 per visit, cash only  °[facility] ° [address], [place] (336) 641-4533 Patients are seen by appointment only. Walk-ins are not  accepted. [facility] will see patients 18 years of age and older. °One Wednesday Evening (Monthly: Volunteer Based).  $30 per visit, cash only  °[facility]  (919) 537-3737 for adults; Children under age 4, call Graduate Pediatric Dentistry at (919) [phone]. Children aged 4-14, please call (919) [phone] to request a pediatric application. ° Dental services are provided in all areas of dental care including fillings, crowns and bridges, complete and partial dentures, implants, gum treatment, root canals, and extractions. Preventive care is also provided. Treatment is provided to both adults and children. °Patients are selected via a lottery and there is often a waiting list. °  °[facility] [address], °[place] ° (336) 763-8833 [email or web address].[email or web address] °  °[facility] [address], [place], [place] (336)723-1848, Ext. 123 Second and Fourth Thursday of each month, opens at 6:30 AM; Clinic ends at 9 AM.  Patients are seen on a first-come first-served basis, and a limited number are seen during each clinic.  ° °[facility] ° [address], [place], [place] (336) 723-7904   Eligibility Requirements °You must have lived in [place], [place], or [place] counties for at least the last three months. °  You cannot be eligible for state or federal sponsored healthcare insurance, including Veterans Administration, Medicaid, or Medicare. °  You generally cannot be eligible for healthcare insurance through your employer.  °  How to apply: °Eligibility screenings are held every Tuesday and Wednesday afternoon from 1:00 pm until 4:00 pm. You do not need an appointment for the interview!  °[facility] [address], [place], [place] 336-631-2330   °[facility]  336-342-8273   °[facility]  336-703-3100   °[facility]  336-570-6415   ° °Behavioral Health Resources in the  Community: °Intensive Outpatient Programs °Organization         Address  Phone  Notes  °[facility] [address], [place], [place] 336-878-6098   °[facility] [address], [place], [place] 336-832-9800   °[facility] [address], [place], [place] ° 336-882-2125   °[facility] [address],  °[place], [place] 1-800-853-5163 or 336-641-4981   °Substance Abuse Resources °Organization           Address  Phone  Notes  °[facility]  336-882-2125   °[facility]  336-784-9470   °[facility]  336-285-9073   °[facility]  336-845-3988   °Residential & Outpatient Substance Abuse Program  1-800-659-3381   °Psychological Services °Organization         Address  Phone  Notes  °[facility]  336- 832-9600   °[facility]  336- 378-7881   °[facility] [address], [place] 1-800-853-5163 or 336-641-4981   ° °Mobile Crisis Teams °Organization         Address  Phone  Notes  °[facility], Mobile Crisis Care Unit  1-877-626-1772   °Assertive °[facility] ° [address] [place], [place] 336-834-9664   °Sharon DeEsch [address] °[place] 336-554-5454   ° °Self-Help/Support Groups °Organization         Address  Phone             Notes  °[facility] - variety of support groups  336- 373-1402 Call for more information  °Narcotics Anonymous (NA), [facility] [address], °[place]  2 meetings at this location  ° °Residential Treatment Programs °Organization         Address  Phone  Notes  °[facility] [address],    °[place]  1-866-801-8205   °[facility] ° [address], [place], [place] 704-293-8524   °[facility] [address], [place] 336-845-3988 Admissions: 8am-3pm M-F  °[facility] [address]    °[place], [place] 336-841-1104   °[facility] [address], [place], [place] 336-379-7146   °[facility] [address]  °[place], [place] 336-285-9073   °[facility] - Intensive Outpatient [address], [place], [place] 336-852-3033   °[facility] [address]  °[place], [place] 1-877-615-2722 or 336-784-9470   °[facility] [address] [place], [place] 336-227-7417 Accepts Medicaid  °[facility] [address]  °[place] 1-800-659-3381 Substance Abuse/Addiction Treatment  ° °[place] Behavioral Health Resources °Organization         Address  Phone  Notes  °[facility]  (888) 581-9988   °Julie Brannon, PhD [address] [place], [place]   (336) 349-5553 or (336) 951-0000   °[facility]   [address] °[place], [place] (336) 349-4454   °[facility] [address], [place], [place] (336) 342-8316 Insurance/Medicaid/sponsorship through [facility]  °[facility] [address]                                    [place], [place] (336) 342-8316 Therapy/tele-psych/case  °[facility] [address]  ° [place], [place] (336) 349-2233    °Dr. Arfeen  (336) 349-4544   °[facility] [place]  [employer] [place] [facility] 1) [address], [place] °2) [address], [place] °3)  [address], [place] (336) 349-3220 °(336) 342-7768 ° °(336) 342-8140   °[place] Child Abuse [facility] (336) 342-1394 or (336) 342-3537 (After Hours)    ° ° ° °

## 2014-06-09 LAB — URINE CULTURE

## 2014-06-10 NOTE — ED Provider Notes (Signed)
Medical screening examination/treatment/procedure(s) were performed by non-physician practitioner and as supervising physician I was immediately available for consultation/collaboration.   EKG Interpretation   Date/Time:  Tuesday June 07 2014 15:36:07 EDT Ventricular Rate:  115 PR Interval:  121 QRS Duration: 80 QT Interval:  349 QTC Calculation: 483 R Axis:   82 Text Interpretation:  Sinus tachycardia Nonspecific T abnormalities,  anterior leads Borderline prolonged QT interval No old tracing to compare  Confirmed by Betina Puckett  MD, Dawnna Gritz ([id number]) on 06/07/2014 3:38:21 PM        Holly CamelScott T Stella Bortle, MD 06/10/14 2205

## 2014-06-11 ENCOUNTER — Telehealth (HOSPITAL_BASED_OUTPATIENT_CLINIC_OR_DEPARTMENT_OTHER): Payer: Self-pay

## 2014-06-11 NOTE — Telephone Encounter (Signed)
Post ED Visit - Positive Culture Follow-up  Culture report reviewed by antimicrobial stewardship pharmacist: []  Wes Dulaney, Pharm.D., BCPS []  Celedonio MiyamotoJeremy Frens, Pharm.D., BCPS []  Georgina PillionElizabeth Martin, Pharm.D., BCPS [x]  MartinMinh Pham, [address]Pharm[address]D., BCPS, AAHIVP []  Estella HuskMichelle Turner, Pharm.D., BCPS, AAHIVP []    Positive Urine culture, >/= 100,000 colonies -> E Coli Treated with Keflex, organism sensitive to the same and no further patient follow-up is required at this time.  Arvid RightClark, Justise Ehmann Dorn 06/11/2014, 3:01 AM

## 2015-08-30 IMAGING — CT CT ABD-PELV W/O CM
1 series · 14 of 18 positions shown, 19 images · non-contrast
Comparison: No priors.

CLINICAL DATA: Right-sided flank pain.

EXAM:
CT ABDOMEN AND PELVIS WITHOUT CONTRAST
TECHNIQUE: Multidetector CT imaging of the abdomen and pelvis was performed
following the standard protocol without IV contrast.

[Series 6: lung · axial · 0.73mm/px · z∈[+1964,+2039]mm · 14 of 18 slices shown, 19 images]
[im 2/18  soft-tissue]
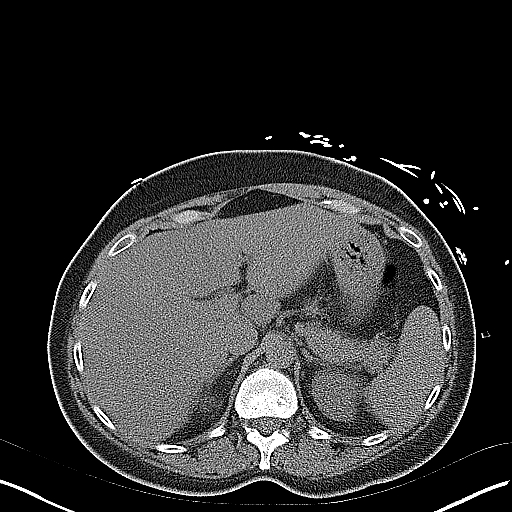
[im 2/18  bone]
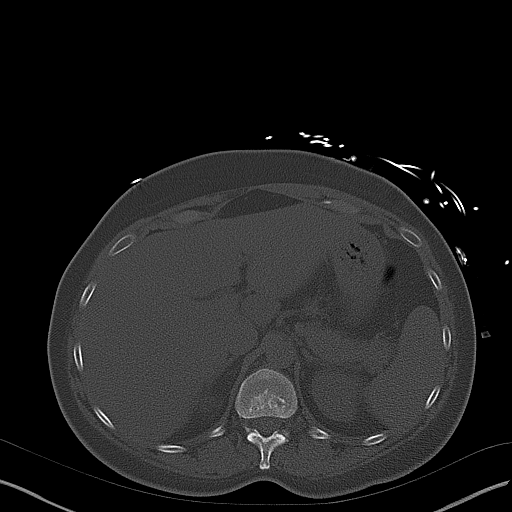
[im 3/18  soft-tissue]
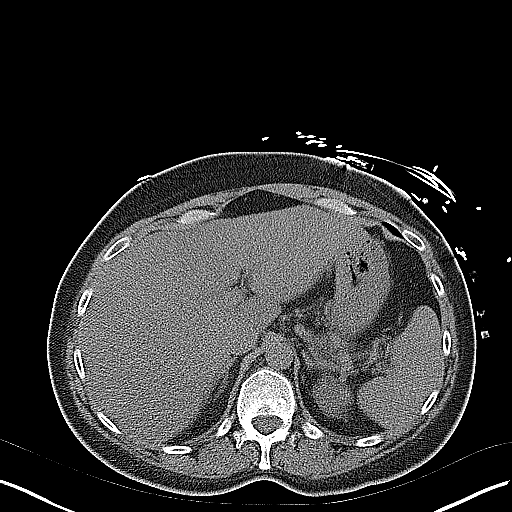
[im 5/18  soft-tissue]
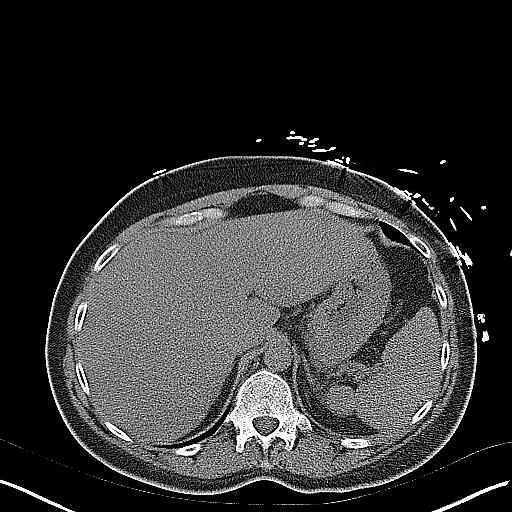
[im 6/18  soft-tissue]
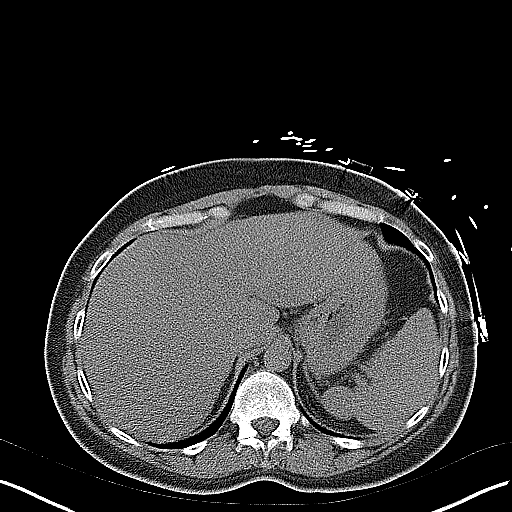
[im 7/18  soft-tissue]
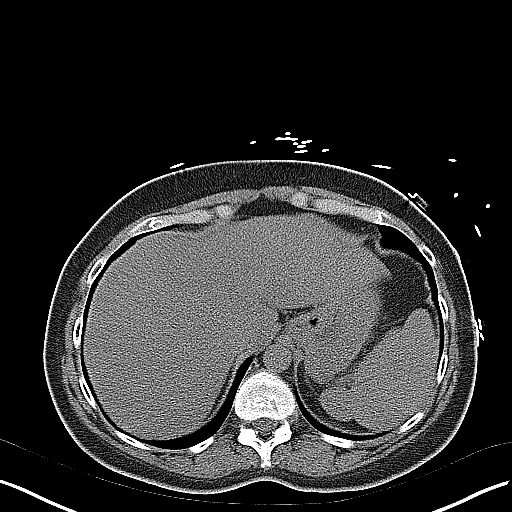
[im 8/18  soft-tissue]
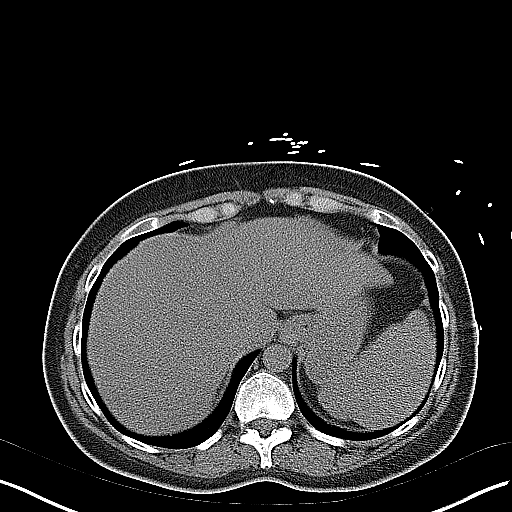
[im 10/18  soft-tissue]
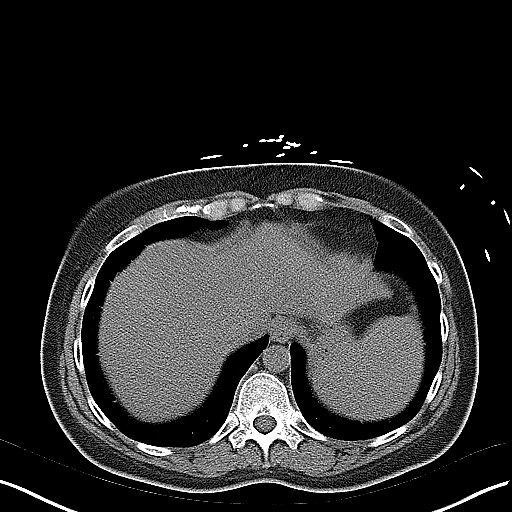
[im 11/18  soft-tissue]
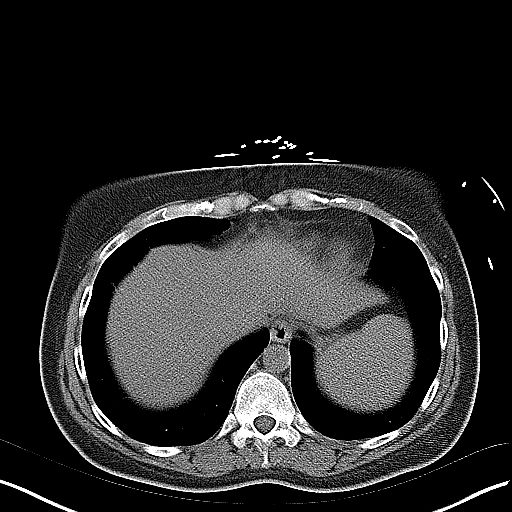
[im 12/18  soft-tissue]
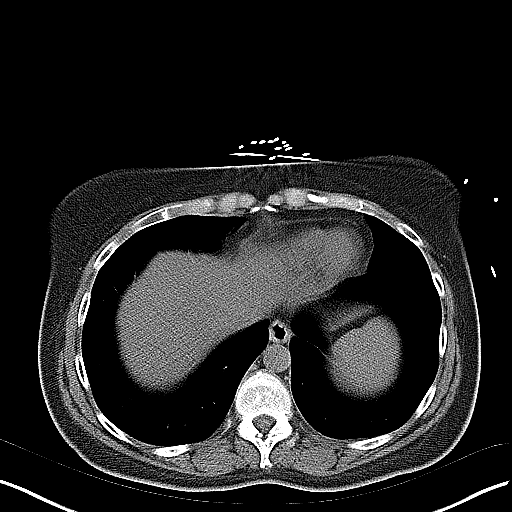
[im 12/18  bone]
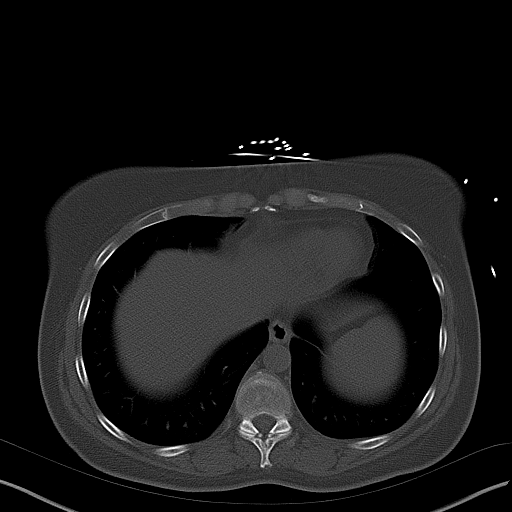
[im 13/18  soft-tissue]
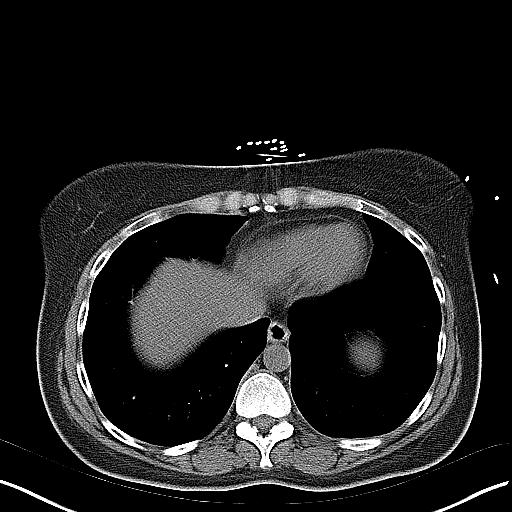
[im 14/18  soft-tissue]
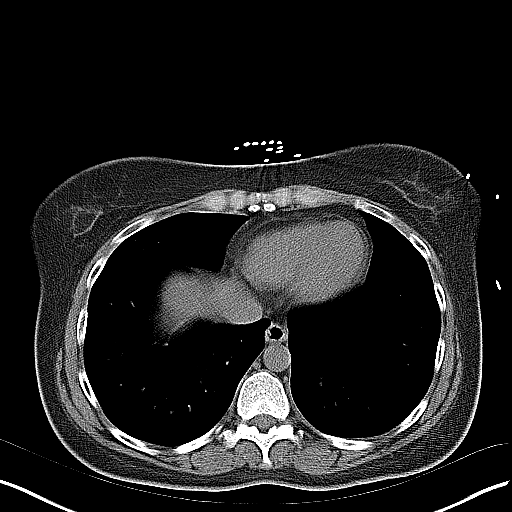
[im 14/18  lung]
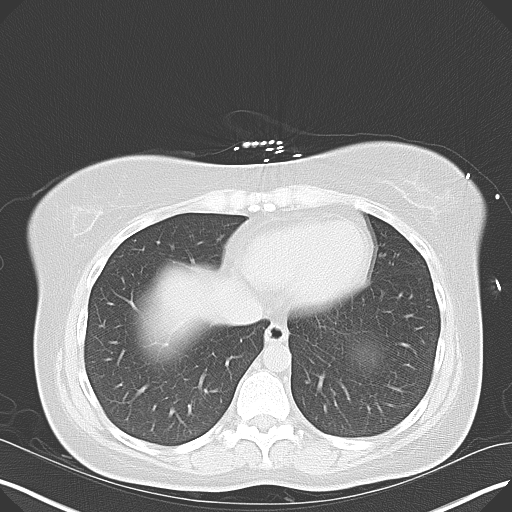
[im 15/18  lung]
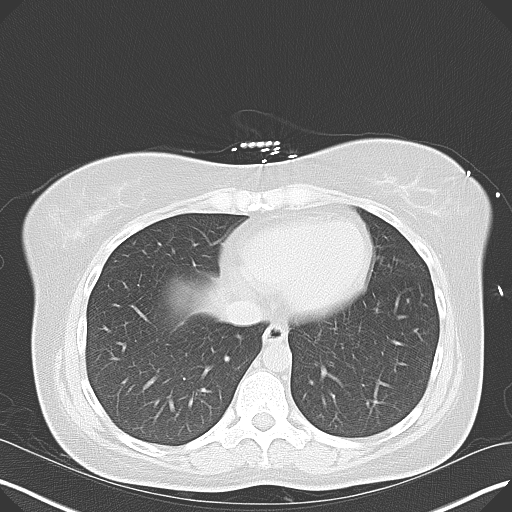
[im 16/18  soft-tissue]
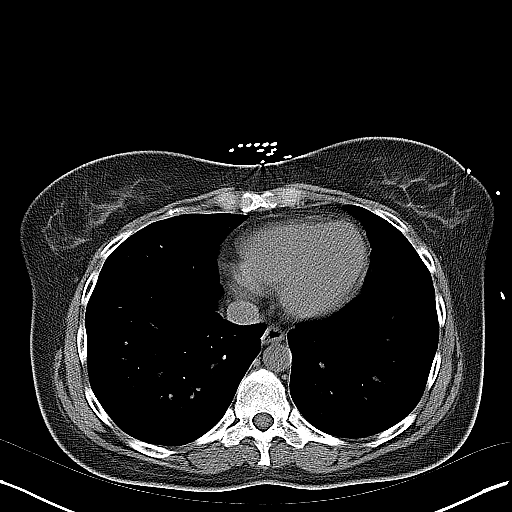
[im 16/18  lung]
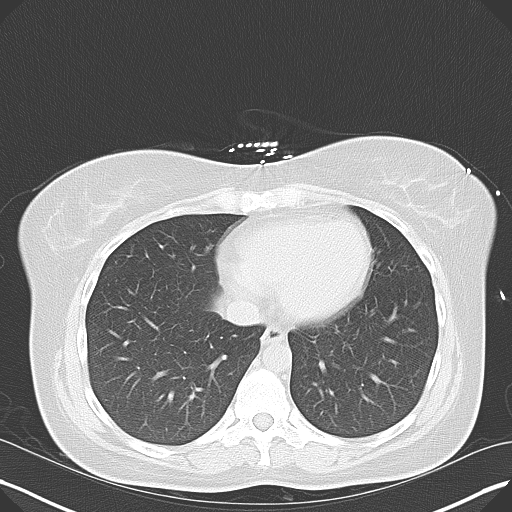
[im 17/18  soft-tissue]
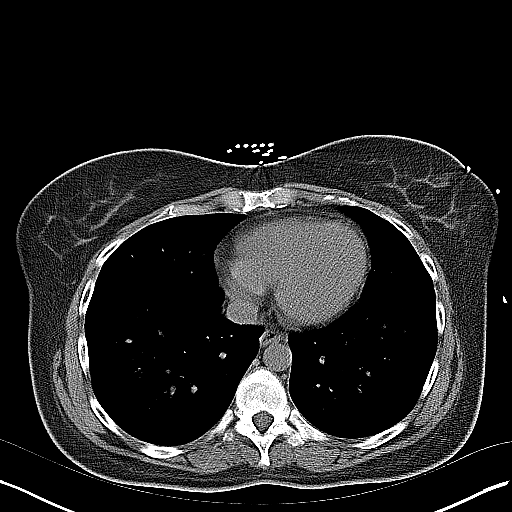
[im 17/18  lung]
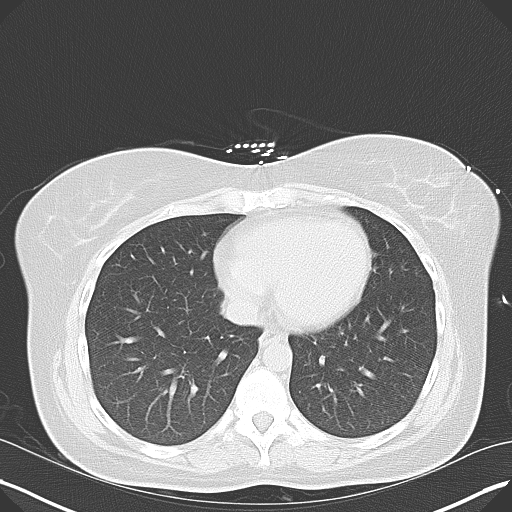

[14 of 18 positions shown; findings below may reference images not displayed]

FINDINGS: Lung Bases: Unremarkable.

Abdomen/Pelvis: No abnormal calculi are noted within the collecting
system of either kidney, along the course of either ureter or within
the lumen of the urinary bladder. There is, however, mild right
hydroureteronephrosis and extensive perinephric stranding. The
unenhanced appearance of the kidneys is otherwise unremarkable.

Diffuse low attenuation throughout the hepatic parenchyma,
compatible with hepatic steatosis. At least 2 rim calcified
gallstones are present within the gallbladder, largest of which is
in the neck measuring 19 mm in diameter. No current findings to
suggest an acute cholecystitis at this time. The unenhanced
appearance of the pancreas, spleen and bilateral adrenal glands is
unremarkable. No significant volume of ascites. No pneumoperitoneum.
No pathologic distention of small bowel. Normal appendix. No
definite lymphadenopathy identified within the abdomen or pelvis on
today's non contrast CT examination. Uterus and ovaries are
unremarkable in appearance. Urinary bladder is normal in appearance.

Musculoskeletal: There are no aggressive appearing lytic or blastic
lesions noted in the visualized portions of the skeleton.
IMPRESSION: 1. No urinary tract calculi noted.
2. There is mild right hydroureteronephrosis and extensive
right-sided perinephric stranding. This may simply reflect an upper
tract urinary infection such as pyelonephritis, and correlation with
urinalysis is recommended. Alternatively, similar findings could be
seen in the setting of a recently passed calculus.
3. Hepatic steatosis.
4. Cholelithiasis without evidence to suggest acute cholecystitis at
this time.
5. Normal appendix.

## 2019-04-02 DIAGNOSIS — Z5321 Procedure and treatment not carried out due to patient leaving prior to being seen by health care provider: Secondary | ICD-10-CM | POA: Insufficient documentation

## 2019-04-02 DIAGNOSIS — Z7141 Alcohol abuse counseling and surveillance of alcoholic: Secondary | ICD-10-CM | POA: Insufficient documentation

## 2019-04-03 ENCOUNTER — Emergency Department (HOSPITAL_COMMUNITY)
Admission: EM | Admit: 2019-04-03 | Discharge: 2019-04-03 | Disposition: A | Discharge status: Home / Self Care | Payer: Medicaid Other | Attending: Emergency Medicine | Admitting: Emergency Medicine

## 2019-04-03 ENCOUNTER — Encounter (HOSPITAL_COMMUNITY): Payer: Self-pay | Admitting: Emergency Medicine

## 2019-04-03 DIAGNOSIS — F1910 Other psychoactive substance abuse, uncomplicated: Secondary | ICD-10-CM

## 2019-04-03 MED ORDER — NICOTINE 21 MG/24HR TD PT24
21.0000 mg | MEDICATED_PATCH | Freq: Once | TRANSDERMAL | Status: DC
  Filled 2019-04-03: qty 1

## 2019-04-03 NOTE — ED Triage Notes (Signed)
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 States that she last use yesterday. Denies SI.

## 2019-04-03 NOTE — ED Notes (Signed)
Bed: [facility] Expected date:  Expected time:  Means of arrival:  Comments: 

## 2019-04-03 NOTE — ED Provider Notes (Signed)
Patient eloped from the emergency department before provider evaluation. I did not see or evaluate this patient. Triage note states patient arrives from home requesting detox from opioids and alcohol with last use yesterday.  Vitals at triage did show tachycardia.    Holly Castro, [name] 04/03/19 0117    [name], [name], DO 04/09/19 [phone]

## 2019-04-03 NOTE — ED Notes (Signed)
Bed: [facility] Expected date:  Expected time:  Means of arrival:  Comments: EVS zapp @ 2125

## 2019-04-03 NOTE — ED Notes (Signed)
Pt didn't want to wait unless someone was sitting and holding her hand. Pt walked out of the Emergency department

## 2022-08-13 ENCOUNTER — Ambulatory Visit (INDEPENDENT_AMBULATORY_CARE_PROVIDER_SITE_OTHER): Payer: Medicaid Other | Admitting: Podiatry

## 2022-08-13 DIAGNOSIS — Z91199 Patient's noncompliance with other medical treatment and regimen due to unspecified reason: Secondary | ICD-10-CM

## 2022-08-13 NOTE — Progress Notes (Signed)
Pt was a no show for this apt, medicaid, no charge
